# Patient Record
Sex: Male | Born: 1960 | Race: White | Hispanic: No | Marital: Married | Smoking: Never smoker
Health system: Southern US, Community
[De-identification: ages and names within clinical notes are randomized; demographics above are authoritative.]

## PROBLEM LIST (undated history)

## (undated) DIAGNOSIS — I4891 Unspecified atrial fibrillation: Secondary | ICD-10-CM

## (undated) DIAGNOSIS — I9789 Other postprocedural complications and disorders of the circulatory system, not elsewhere classified: Secondary | ICD-10-CM

## (undated) DIAGNOSIS — I779 Disorder of arteries and arterioles, unspecified: Secondary | ICD-10-CM

## (undated) DIAGNOSIS — I255 Ischemic cardiomyopathy: Secondary | ICD-10-CM

## (undated) DIAGNOSIS — I340 Nonrheumatic mitral (valve) insufficiency: Secondary | ICD-10-CM

## (undated) DIAGNOSIS — E785 Hyperlipidemia, unspecified: Secondary | ICD-10-CM

## (undated) DIAGNOSIS — I2510 Atherosclerotic heart disease of native coronary artery without angina pectoris: Secondary | ICD-10-CM

## (undated) DIAGNOSIS — I6529 Occlusion and stenosis of unspecified carotid artery: Secondary | ICD-10-CM

## (undated) DIAGNOSIS — I10 Essential (primary) hypertension: Secondary | ICD-10-CM

## (undated) DIAGNOSIS — Z9289 Personal history of other medical treatment: Secondary | ICD-10-CM

## (undated) HISTORY — DX: Essential (primary) hypertension: I10

## (undated) HISTORY — DX: Occlusion and stenosis of unspecified carotid artery: I65.29

## (undated) HISTORY — DX: Hyperlipidemia, unspecified: E78.5

## (undated) HISTORY — DX: Other postprocedural complications and disorders of the circulatory system, not elsewhere classified: I97.89

## (undated) HISTORY — DX: Atherosclerotic heart disease of native coronary artery without angina pectoris: I25.10

## (undated) HISTORY — DX: Unspecified atrial fibrillation: I48.91

## (undated) HISTORY — DX: Personal history of other medical treatment: Z92.89

---

## 2011-11-11 ENCOUNTER — Encounter: Payer: Self-pay | Admitting: Internal Medicine

## 2011-12-12 ENCOUNTER — Encounter: Payer: Self-pay | Admitting: Gastroenterology

## 2011-12-12 ENCOUNTER — Ambulatory Visit (AMBULATORY_SURGERY_CENTER): Payer: PRIVATE HEALTH INSURANCE | Admitting: *Deleted

## 2011-12-12 VITALS — Ht 71.0 in | Wt 185.0 lb

## 2011-12-12 DIAGNOSIS — Z1211 Encounter for screening for malignant neoplasm of colon: Secondary | ICD-10-CM

## 2011-12-12 MED ORDER — PEG-KCL-NACL-NASULF-NA ASC-C 100 G PO SOLR: ORAL | Status: DC

## 2011-12-15 ENCOUNTER — Encounter: Payer: Self-pay | Admitting: Internal Medicine

## 2011-12-26 ENCOUNTER — Ambulatory Visit (AMBULATORY_SURGERY_CENTER): Payer: PRIVATE HEALTH INSURANCE | Admitting: Gastroenterology

## 2011-12-26 ENCOUNTER — Encounter: Payer: Self-pay | Admitting: Gastroenterology

## 2011-12-26 VITALS — BP 131/85 | HR 72 | Temp 98.0°F | Resp 12 | Ht 71.0 in | Wt 185.0 lb

## 2011-12-26 DIAGNOSIS — K621 Rectal polyp: Secondary | ICD-10-CM

## 2011-12-26 DIAGNOSIS — K620 Anal polyp: Secondary | ICD-10-CM

## 2011-12-26 DIAGNOSIS — D126 Benign neoplasm of colon, unspecified: Secondary | ICD-10-CM

## 2011-12-26 DIAGNOSIS — Z1211 Encounter for screening for malignant neoplasm of colon: Secondary | ICD-10-CM

## 2011-12-26 MED ORDER — SODIUM CHLORIDE 0.9 % IV SOLN: 500.0000 mL | INTRAVENOUS | Status: DC

## 2011-12-26 NOTE — Patient Instructions (Signed)
YOU HAD AN ENDOSCOPIC PROCEDURE TODAY AT THE [facility]: Refer to the procedure report that was given to you for any specific questions about what was found during the examination.  If the procedure report does not answer your questions, please call your gastroenterologist to clarify.  If you requested that your care partner not be given the details of your procedure findings, then the procedure report has been included in a sealed envelope for you to review at your convenience later.  YOU SHOULD EXPECT: Some feelings of bloating in the abdomen. Passage of more gas than usual.  Walking can help get rid of the air that was put into your GI tract during the procedure and reduce the bloating. If you had a lower endoscopy (such as a colonoscopy or flexible sigmoidoscopy) you may notice spotting of blood in your stool or on the toilet paper. If you underwent a bowel prep for your procedure, then you may not have a normal bowel movement for a few days.  DIET: Your first meal following the procedure should be a light meal and then it is ok to progress to your normal diet.  A half-sandwich or bowl of soup is an example of a good first meal.  Heavy or fried foods are harder to digest and may make you feel nauseous or bloated.  Likewise meals heavy in dairy and vegetables can cause extra gas to form and this can also increase the bloating.  Drink plenty of fluids but you should avoid alcoholic beverages for 24 hours.  ACTIVITY: Your care partner should take you home directly after the procedure.  You should plan to take it easy, moving slowly for the rest of the day.  You can resume normal activity the day after the procedure however you should NOT DRIVE or use heavy machinery for 24 hours (because of the sedation medicines used during the test).    SYMPTOMS TO REPORT IMMEDIATELY: A gastroenterologist can be reached at any hour.  During normal business hours, 8:30 AM to 5:00 PM Monday through Friday,  call [phone].  After hours and on weekends, please call the GI answering service at [phone] who will take a message and have the physician on call contact you.   Following lower endoscopy (colonoscopy or flexible sigmoidoscopy):  Excessive amounts of blood in the stool  Significant tenderness or worsening of abdominal pains  Swelling of the abdomen that is new, acute  Fever of 100F or higher    FOLLOW UP: If any biopsies were taken you will be contacted by phone or by letter within the next 1-3 weeks.  Call your gastroenterologist if you have not heard about the biopsies in 3 weeks.  Our staff will call the home number listed on your records the next business day following your procedure to check on you and address any questions or concerns that you may have at that time regarding the information given to you following your procedure. This is a courtesy call and so if there is no answer at the home number and we have not heard from you through the emergency physician on call, we will assume that you have returned to your regular daily activities without incident.  SIGNATURES/CONFIDENTIALITY: You and/or your care partner have signed paperwork which will be entered into your electronic medical record.  These signatures attest to the fact that that the information above on your After Visit Summary has been reviewed and is understood.  Full responsibility of the confidentiality   of this discharge information lies with you and/or your care-partner.    You will receive a letter from Dr. Jarold Motto with results of biopsy.

## 2011-12-26 NOTE — Addendum Note (Signed)
Addended by: Margo Aye on: 12/26/2011 09:31 AM   Modules accepted: Orders

## 2011-12-26 NOTE — Progress Notes (Signed)
The pt tolerated the colonoscopy very well. Maw   

## 2011-12-26 NOTE — Op Note (Signed)
[facility] [address] [place], [place]  [address]  COLONOSCOPY PROCEDURE REPORT  PATIENT:  Joel, Berger  MR#:  [id number] BIRTHDATE:  05-28-1961, 51 yrs. old  GENDER:  male ENDOSCOPIST:  Vania Rea. Jarold Motto, MD, [facility] REF. BY: PROCEDURE DATE:  12/26/2011 PROCEDURE:  Colonoscopy with biopsy ASA CLASS:  Class II INDICATIONS:  Routine Risk Screening MEDICATIONS:   propofol (Diprivan) 300 mg IV  DESCRIPTION OF PROCEDURE:   After the risks and benefits and of the procedure were explained, informed consent was obtained. Digital rectal exam was performed and revealed no abnormalities. The LB CF-[id number] endoscope was introduced through the anus and advanced to the .  The quality of the prep was adequate, using MoviPrep.  The instrument was then slowly withdrawn as the colon was fully examined. <<PROCEDUREIMAGES>>  FINDINGS:  No polyps or cancers were seen. A SMALL RECTAL NODULE COLD BIOPSY REMOVED.  This was otherwise a normal examination of the colon.   Retroflexed views in the rectum revealed no abnormalities.    The scope was then withdrawn from the patient and the procedure completed.  COMPLICATIONS:  None ENDOSCOPIC IMPRESSION: 1) No polyps or cancers 2) Otherwise normal examination RECOMMENDATIONS: 1) Await biopsy results 2) Repeat colonoscopy in 5 years if polyp adenomatous; otherwise 10 years 3) Continue current medications  REPEAT EXAM:  No  ______________________________ Vania Rea. Jarold Motto, MD, [facility]  CC:  n. eSIGNED:   Vania Rea. Eulia Hatcher at 12/26/2011 08:54 AM  [place], Riley Lam, [phone]

## 2011-12-26 NOTE — Progress Notes (Signed)
Patient did not experience any of the following events: a burn prior to discharge; a fall within the facility; wrong site/side/patient/procedure/implant event; or a hospital transfer or hospital admission upon discharge from the facility. (G8907) Patient did not have preoperative order for IV antibiotic SSI prophylaxis. (G8918)  

## 2011-12-27 ENCOUNTER — Telehealth: Payer: Self-pay

## 2011-12-27 NOTE — Telephone Encounter (Signed)
  Follow up Call-  Call back number 12/26/2011  Post procedure Call Back phone  # [phone]  Permission to leave phone message Yes     Patient questions:  Do you have a fever, pain , or abdominal swelling? no Pain Score  0 *  Have you tolerated food without any problems? yes  Have you been able to return to your normal activities? yes  Do you have any questions about your discharge instructions: Diet   no Medications  no Follow up visit  no  Do you have questions or concerns about your Care? no  Actions: * If pain score is 4 or above: No action needed, pain <4.  Per the pt, "I am okay". Maw

## 2011-12-30 ENCOUNTER — Encounter: Payer: Self-pay | Admitting: Gastroenterology

## 2015-03-01 ENCOUNTER — Inpatient Hospital Stay (HOSPITAL_COMMUNITY)
Admission: EM | Admit: 2015-03-01 | Discharge: 2015-03-08 | DRG: 234 | Disposition: A | Discharge status: Home / Self Care | Payer: 59 | Attending: Cardiothoracic Surgery | Admitting: Cardiothoracic Surgery

## 2015-03-01 ENCOUNTER — Encounter (HOSPITAL_COMMUNITY): Payer: Self-pay | Admitting: Emergency Medicine

## 2015-03-01 ENCOUNTER — Emergency Department (HOSPITAL_COMMUNITY): Payer: 59

## 2015-03-01 ENCOUNTER — Encounter (HOSPITAL_COMMUNITY)
Admission: EM | Disposition: A | Discharge status: Home / Self Care | Payer: Self-pay | Source: Home / Self Care | Attending: Cardiothoracic Surgery

## 2015-03-01 DIAGNOSIS — I4891 Unspecified atrial fibrillation: Secondary | ICD-10-CM | POA: Diagnosis not present

## 2015-03-01 DIAGNOSIS — Z01810 Encounter for preprocedural cardiovascular examination: Secondary | ICD-10-CM | POA: Diagnosis not present

## 2015-03-01 DIAGNOSIS — I214 Non-ST elevation (NSTEMI) myocardial infarction: Secondary | ICD-10-CM

## 2015-03-01 DIAGNOSIS — R079 Chest pain, unspecified: Secondary | ICD-10-CM | POA: Diagnosis present

## 2015-03-01 DIAGNOSIS — E119 Type 2 diabetes mellitus without complications: Secondary | ICD-10-CM

## 2015-03-01 DIAGNOSIS — R0602 Shortness of breath: Secondary | ICD-10-CM

## 2015-03-01 DIAGNOSIS — I10 Essential (primary) hypertension: Secondary | ICD-10-CM | POA: Diagnosis not present

## 2015-03-01 DIAGNOSIS — I25111 Atherosclerotic heart disease of native coronary artery with angina pectoris with documented spasm: Secondary | ICD-10-CM

## 2015-03-01 DIAGNOSIS — I2119 ST elevation (STEMI) myocardial infarction involving other coronary artery of inferior wall: Principal | ICD-10-CM | POA: Diagnosis present

## 2015-03-01 DIAGNOSIS — I2111 ST elevation (STEMI) myocardial infarction involving right coronary artery: Secondary | ICD-10-CM

## 2015-03-01 DIAGNOSIS — E785 Hyperlipidemia, unspecified: Secondary | ICD-10-CM | POA: Diagnosis present

## 2015-03-01 DIAGNOSIS — I4892 Unspecified atrial flutter: Secondary | ICD-10-CM | POA: Diagnosis not present

## 2015-03-01 DIAGNOSIS — I319 Disease of pericardium, unspecified: Secondary | ICD-10-CM | POA: Diagnosis not present

## 2015-03-01 DIAGNOSIS — I213 ST elevation (STEMI) myocardial infarction of unspecified site: Secondary | ICD-10-CM | POA: Diagnosis present

## 2015-03-01 DIAGNOSIS — I25110 Atherosclerotic heart disease of native coronary artery with unstable angina pectoris: Secondary | ICD-10-CM | POA: Diagnosis present

## 2015-03-01 DIAGNOSIS — E8770 Fluid overload, unspecified: Secondary | ICD-10-CM | POA: Diagnosis not present

## 2015-03-01 DIAGNOSIS — I241 Dressler's syndrome: Secondary | ICD-10-CM | POA: Diagnosis not present

## 2015-03-01 DIAGNOSIS — I441 Atrioventricular block, second degree: Secondary | ICD-10-CM | POA: Diagnosis not present

## 2015-03-01 DIAGNOSIS — I2510 Atherosclerotic heart disease of native coronary artery without angina pectoris: Secondary | ICD-10-CM | POA: Diagnosis not present

## 2015-03-01 DIAGNOSIS — Z8249 Family history of ischemic heart disease and other diseases of the circulatory system: Secondary | ICD-10-CM

## 2015-03-01 DIAGNOSIS — Z951 Presence of aortocoronary bypass graft: Secondary | ICD-10-CM

## 2015-03-01 DIAGNOSIS — I9789 Other postprocedural complications and disorders of the circulatory system, not elsewhere classified: Secondary | ICD-10-CM

## 2015-03-01 HISTORY — PX: CARDIAC CATHETERIZATION: SHX172

## 2015-03-01 LAB — I-STAT CHEM 8, ED
BUN: 21 mg/dL — ABNORMAL HIGH (ref 6–20)
CALCIUM ION: 1.16 mmol/L (ref 1.12–1.23)
CHLORIDE: 97 mmol/L — AB (ref 101–111)
Creatinine, Ser: 0.7 mg/dL (ref 0.61–1.24)
Glucose, Bld: 263 mg/dL — ABNORMAL HIGH (ref 65–99)
HEMATOCRIT: 47 % (ref 39.0–52.0)
Hemoglobin: 16 g/dL (ref 13.0–17.0)
Potassium: 3.7 mmol/L (ref 3.5–5.1)
SODIUM: 138 mmol/L (ref 135–145)
TCO2: 27 mmol/L (ref 0–100)

## 2015-03-01 LAB — CBC
HEMATOCRIT: 43.2 % (ref 39.0–52.0)
HEMOGLOBIN: 15 g/dL (ref 13.0–17.0)
MCH: 29.7 pg (ref 26.0–34.0)
MCHC: 34.7 g/dL (ref 30.0–36.0)
MCV: 85.5 fL (ref 78.0–100.0)
Platelets: 196 10*3/uL (ref 150–400)
RBC: 5.05 MIL/uL (ref 4.22–5.81)
RDW: 13.1 % (ref 11.5–15.5)
WBC: 11.6 10*3/uL — AB (ref 4.0–10.5)

## 2015-03-01 LAB — BASIC METABOLIC PANEL
ANION GAP: 9 (ref 5–15)
BUN: 19 mg/dL (ref 6–20)
CALCIUM: 9.2 mg/dL (ref 8.9–10.3)
CO2: 27 mmol/L (ref 22–32)
Chloride: 99 mmol/L — ABNORMAL LOW (ref 101–111)
Creatinine, Ser: 0.67 mg/dL (ref 0.61–1.24)
GLUCOSE: 266 mg/dL — AB (ref 65–99)
POTASSIUM: 3.8 mmol/L (ref 3.5–5.1)
SODIUM: 135 mmol/L (ref 135–145)

## 2015-03-01 LAB — HEPATIC FUNCTION PANEL
ALBUMIN: 4.5 g/dL (ref 3.5–5.0)
ALT: 52 U/L (ref 17–63)
AST: 215 U/L — AB (ref 15–41)
Alkaline Phosphatase: 84 U/L (ref 38–126)
BILIRUBIN DIRECT: 0.2 mg/dL (ref 0.1–0.5)
BILIRUBIN TOTAL: 1.5 mg/dL — AB (ref 0.3–1.2)
Indirect Bilirubin: 1.3 mg/dL — ABNORMAL HIGH (ref 0.3–0.9)
Total Protein: 7.8 g/dL (ref 6.5–8.1)

## 2015-03-01 LAB — LIPASE, BLOOD: LIPASE: 15 U/L — AB (ref 22–51)

## 2015-03-01 LAB — GLUCOSE, CAPILLARY
GLUCOSE-CAPILLARY: 189 mg/dL — AB (ref 65–99)
GLUCOSE-CAPILLARY: 186 mg/dL — AB (ref 65–99)
Glucose-Capillary: 217 mg/dL — ABNORMAL HIGH (ref 65–99)
Glucose-Capillary: 207 mg/dL — ABNORMAL HIGH (ref 65–99)

## 2015-03-01 LAB — I-STAT TROPONIN, ED: TROPONIN I, POC: 21.33 ng/mL — AB (ref 0.00–0.08)

## 2015-03-01 LAB — MRSA PCR SCREENING: MRSA by PCR: NEGATIVE

## 2015-03-01 LAB — TROPONIN I
TROPONIN I: 9.76 ng/mL — AB (ref <0.031)
TROPONIN I: 11.54 ng/mL — AB (ref <0.031)
Troponin I: 16.36 ng/mL (ref <0.031)

## 2015-03-01 LAB — TSH: TSH: 0.678 u[IU]/mL (ref 0.350–4.500)

## 2015-03-01 LAB — APTT: APTT: 29 s (ref 24–37)

## 2015-03-01 LAB — PROTIME-INR
INR: 1.05 (ref 0.00–1.49)
PROTHROMBIN TIME: 13.9 s (ref 11.6–15.2)

## 2015-03-01 SURGERY — LEFT HEART CATH AND CORONARY ANGIOGRAPHY

## 2015-03-01 MED ORDER — ACETAMINOPHEN 325 MG PO TABS
650.0000 mg | ORAL_TABLET | ORAL | Status: DC | PRN
  Administered 2015-03-02: 650 mg via ORAL
  Filled 2015-03-01: qty 2

## 2015-03-01 MED ORDER — NITROGLYCERIN 1 MG/10 ML FOR IR/CATH LAB
INTRA_ARTERIAL | Status: DC | PRN
  Administered 2015-03-01: 11:00:00

## 2015-03-01 MED ORDER — SODIUM CHLORIDE 0.9 % IV SOLN: 250.0000 mL | INTRAVENOUS | Status: DC | PRN

## 2015-03-01 MED ORDER — SODIUM CHLORIDE 0.9 % IJ SOLN: 3.0000 mL | INTRAMUSCULAR | Status: DC | PRN

## 2015-03-01 MED ORDER — ASPIRIN EC 325 MG PO TBEC
325.0000 mg | DELAYED_RELEASE_TABLET | ORAL | Status: AC
  Administered 2015-03-01: 325 mg via ORAL
  Filled 2015-03-01: qty 1

## 2015-03-01 MED ORDER — LISINOPRIL 2.5 MG PO TABS
2.5000 mg | ORAL_TABLET | Freq: Every day | ORAL | Status: DC
  Administered 2015-03-01 – 2015-03-02 (×2): 2.5 mg via ORAL
  Filled 2015-03-01 (×2): qty 1

## 2015-03-01 MED ORDER — MIDAZOLAM HCL 2 MG/2ML IJ SOLN
INTRAMUSCULAR | Status: AC
  Filled 2015-03-01: qty 4

## 2015-03-01 MED ORDER — ONDANSETRON HCL 4 MG/2ML IJ SOLN
INTRAMUSCULAR | Status: AC
  Filled 2015-03-01: qty 2

## 2015-03-01 MED ORDER — VERAPAMIL HCL 2.5 MG/ML IV SOLN
INTRAVENOUS | Status: DC | PRN
  Administered 2015-03-01: 11:00:00 via INTRA_ARTERIAL

## 2015-03-01 MED ORDER — SIMVASTATIN 40 MG PO TABS
40.0000 mg | ORAL_TABLET | Freq: Every day | ORAL | Status: DC
  Administered 2015-03-01 – 2015-03-06 (×5): 40 mg via ORAL
  Filled 2015-03-01: qty 1
  Filled 2015-03-01: qty 2
  Filled 2015-03-01 (×3): qty 1
  Filled 2015-03-01: qty 2
  Filled 2015-03-01 (×3): qty 1

## 2015-03-01 MED ORDER — NITROGLYCERIN 1 MG/10 ML FOR IR/CATH LAB
INTRA_ARTERIAL | Status: AC
  Filled 2015-03-01: qty 10

## 2015-03-01 MED ORDER — SODIUM CHLORIDE 0.9 % IJ SOLN
3.0000 mL | Freq: Two times a day (BID) | INTRAMUSCULAR | Status: DC
  Administered 2015-03-01 (×2): 3 mL via INTRAVENOUS

## 2015-03-01 MED ORDER — NITROGLYCERIN IN D5W 200-5 MCG/ML-% IV SOLN
5.0000 ug/min | INTRAVENOUS | Status: DC
  Administered 2015-03-01: 5 ug/min via INTRAVENOUS
  Administered 2015-03-01: 10 ug/min via INTRAVENOUS
  Administered 2015-03-03: 30 ug/min via INTRAVENOUS
  Filled 2015-03-01: qty 250

## 2015-03-01 MED ORDER — METOPROLOL TARTRATE 12.5 MG HALF TABLET
12.5000 mg | ORAL_TABLET | Freq: Two times a day (BID) | ORAL | Status: DC
  Administered 2015-03-01 – 2015-03-02 (×4): 12.5 mg via ORAL
  Filled 2015-03-01 (×5): qty 1

## 2015-03-01 MED ORDER — ASPIRIN EC 325 MG PO TBEC: 325.0000 mg | DELAYED_RELEASE_TABLET | Freq: Every day | ORAL | Status: DC

## 2015-03-01 MED ORDER — ROPINIROLE HCL 0.5 MG PO TABS
0.5000 mg | ORAL_TABLET | Freq: Every day | ORAL | Status: DC
  Administered 2015-03-01 – 2015-03-02 (×2): 0.5 mg via ORAL
  Filled 2015-03-01 (×3): qty 1

## 2015-03-01 MED ORDER — SODIUM CHLORIDE 0.9 % IV SOLN
INTRAVENOUS | Status: DC
Start: 2015-03-02 — End: 2015-03-01

## 2015-03-01 MED ORDER — GLIPIZIDE 10 MG PO TABS
10.0000 mg | ORAL_TABLET | Freq: Two times a day (BID) | ORAL | Status: DC
  Administered 2015-03-01 – 2015-03-02 (×3): 10 mg via ORAL
  Filled 2015-03-01 (×6): qty 1

## 2015-03-01 MED ORDER — ACETAMINOPHEN 325 MG PO TABS: 650.0000 mg | ORAL_TABLET | ORAL | Status: DC | PRN

## 2015-03-01 MED ORDER — FENTANYL CITRATE (PF) 100 MCG/2ML IJ SOLN
INTRAMUSCULAR | Status: DC | PRN
  Administered 2015-03-01: 50 ug via INTRAVENOUS

## 2015-03-01 MED ORDER — FENTANYL CITRATE (PF) 100 MCG/2ML IJ SOLN
INTRAMUSCULAR | Status: AC
Start: 2015-03-01 — End: 2015-03-01
  Filled 2015-03-01: qty 4

## 2015-03-01 MED ORDER — SODIUM CHLORIDE 0.9 % IJ SOLN: 3.0000 mL | Freq: Two times a day (BID) | INTRAMUSCULAR | Status: DC

## 2015-03-01 MED ORDER — HEPARIN (PORCINE) IN NACL 100-0.45 UNIT/ML-% IJ SOLN
1500.0000 [IU]/h | INTRAMUSCULAR | Status: DC
  Administered 2015-03-01: 1150 [IU]/h via INTRAVENOUS
  Filled 2015-03-01: qty 250

## 2015-03-01 MED ORDER — SODIUM CHLORIDE 0.9 % WEIGHT BASED INFUSION
1.0000 mL/kg/h | INTRAVENOUS | Status: AC
  Administered 2015-03-01: 1 mL/kg/h via INTRAVENOUS

## 2015-03-01 MED ORDER — ONDANSETRON HCL 4 MG/2ML IJ SOLN
4.0000 mg | Freq: Once | INTRAMUSCULAR | Status: AC
  Administered 2015-03-01: 4 mg via INTRAVENOUS

## 2015-03-01 MED ORDER — HEPARIN BOLUS VIA INFUSION
4000.0000 [IU] | Freq: Once | INTRAVENOUS | Status: AC
  Administered 2015-03-01: 4000 [IU] via INTRAVENOUS
  Filled 2015-03-01: qty 4000

## 2015-03-01 MED ORDER — ATORVASTATIN CALCIUM 80 MG PO TABS: 80.0000 mg | ORAL_TABLET | Freq: Every day | ORAL | Status: DC

## 2015-03-01 MED ORDER — INSULIN ASPART 100 UNIT/ML ~~LOC~~ SOLN
0.0000 [IU] | Freq: Three times a day (TID) | SUBCUTANEOUS | Status: DC
  Administered 2015-03-01: 2 [IU] via SUBCUTANEOUS
  Administered 2015-03-01: 3 [IU] via SUBCUTANEOUS
  Administered 2015-03-02 (×2): 2 [IU] via SUBCUTANEOUS
  Administered 2015-03-02: 3 [IU] via SUBCUTANEOUS

## 2015-03-01 MED ORDER — LIDOCAINE HCL (PF) 1 % IJ SOLN
INTRAMUSCULAR | Status: AC
  Filled 2015-03-01: qty 30

## 2015-03-01 MED ORDER — HEPARIN (PORCINE) IN NACL 100-0.45 UNIT/ML-% IJ SOLN
1200.0000 [IU]/h | INTRAMUSCULAR | Status: DC
  Administered 2015-03-01: 1200 [IU]/h via INTRAVENOUS
  Filled 2015-03-01 (×2): qty 250

## 2015-03-01 MED ORDER — NITROGLYCERIN IN D5W 200-5 MCG/ML-% IV SOLN
INTRAVENOUS | Status: AC
  Filled 2015-03-01: qty 250

## 2015-03-01 MED ORDER — ASPIRIN EC 81 MG PO TBEC
81.0000 mg | DELAYED_RELEASE_TABLET | Freq: Every day | ORAL | Status: DC
  Administered 2015-03-02: 81 mg via ORAL
  Filled 2015-03-01: qty 1

## 2015-03-01 MED ORDER — ONDANSETRON HCL 4 MG/2ML IJ SOLN
4.0000 mg | Freq: Four times a day (QID) | INTRAMUSCULAR | Status: DC | PRN
  Administered 2015-03-01 (×2): 4 mg via INTRAVENOUS
  Filled 2015-03-01 (×2): qty 2

## 2015-03-01 MED ORDER — MIDAZOLAM HCL 2 MG/2ML IJ SOLN
INTRAMUSCULAR | Status: DC | PRN
  Administered 2015-03-01: 1 mg via INTRAVENOUS

## 2015-03-01 MED ORDER — GABAPENTIN 600 MG PO TABS
600.0000 mg | ORAL_TABLET | Freq: Three times a day (TID) | ORAL | Status: DC
  Administered 2015-03-01 – 2015-03-08 (×26): 600 mg via ORAL
  Filled 2015-03-01 (×32): qty 1

## 2015-03-01 MED ORDER — PROMETHAZINE HCL 25 MG/ML IJ SOLN
25.0000 mg | Freq: Four times a day (QID) | INTRAMUSCULAR | Status: DC | PRN
  Administered 2015-03-01: 25 mg via INTRAVENOUS
  Filled 2015-03-01: qty 1

## 2015-03-01 SURGICAL SUPPLY — 8 items
CATH INFINITI 5 FR JL3.5 (CATHETERS) ×3 IMPLANT
CATH INFINITI JR4 5F (CATHETERS) ×3 IMPLANT
GLIDESHEATH SLEND A-KIT 6F 22G (SHEATH) ×3 IMPLANT
KIT HEART LEFT (KITS) ×3 IMPLANT
PACK CARDIAC CATHETERIZATION (CUSTOM PROCEDURE TRAY) ×3 IMPLANT
TRANSDUCER W/MONITORING KIT (MISCELLANEOUS) ×3 IMPLANT
TUBING CIL FLEX 10 FLL-RA (TUBING) ×3 IMPLANT
WIRE SAFE-T 1.5MM-J .035X260CM (WIRE) ×3 IMPLANT

## 2015-03-01 NOTE — H&P (Signed)
Cardiologist: Rexene Edison Delawder is an 54 y.o. male.   Chief Complaint: Chest Pain HPI:  The patient is a 54 year old male with a history of diabetes and hypertension. Patient's father had his first heart attack at age 20 and the subsequent 1 and 28. His brother had one at age 66. The patient reports on Friday he was mowing his grass and became nauseated. He was having a lot of gas and burping. This tapered off. However, yesterday around 4 PM he became nauseated and vomited he was having some slight chest discomfort. He woke at 1 AM with similar symptoms and that 0330 hrs. he developed more chest pain and at 0430hrs it was 8 out of 10. He did feel a little bit sweaty but had no shortness of breath. Currently having 3 out of 10 chest pressure.      Past Medical History  Diagnosis Date  . Diabetes mellitus   . Hypertension     History reviewed. No pertinent past surgical history.   Family History  Problem Relation Age of Onset  . Colon polyps Paternal Uncle   . Colon cancer Neg Hx    Social History:  reports that he has never smoked. He has never used smokeless tobacco. He reports that he drinks alcohol. He reports that he does not use illicit drugs.  Allergies:  Allergies  Allergen Reactions  . Codeine Rash    Medications Prior to Admission  Medication Sig Dispense Refill  . gabapentin (NEURONTIN) 600 MG tablet Take 600 mg by mouth 4 (four) times daily - after meals and at bedtime.    Marland Kitchen glipiZIDE (GLUCOTROL) 10 MG tablet Take 10 mg by mouth Twice daily.     Marland Kitchen lisinopril (PRINIVIL,ZESTRIL) 2.5 MG tablet Take 2.5 mg by mouth Daily.     . metFORMIN (GLUCOPHAGE) 1000 MG tablet Take 1,000 mg by mouth Twice daily.     Marland Kitchen rOPINIRole (REQUIP) 0.5 MG tablet Take 0.5 mg by mouth at bedtime.    Marland Kitchen ACCU-CHEK AVIVA PLUS test strip       Results for orders placed or performed during the hospital encounter of 03/01/15 (from the past 48 hour(s))  Basic metabolic panel     Status: Abnormal     Collection Time: 03/01/15  5:24 AM  Result Value Ref Range   Sodium 135 135 - 145 mmol/L   Potassium 3.8 3.5 - 5.1 mmol/L   Chloride 99 (L) 101 - 111 mmol/L   CO2 27 22 - 32 mmol/L   Glucose, Bld 266 (H) 65 - 99 mg/dL   BUN 19 6 - 20 mg/dL   Creatinine, Ser 0.67 0.61 - 1.24 mg/dL   Calcium 9.2 8.9 - 10.3 mg/dL   GFR calc non Af Amer >60 >60 mL/min   GFR calc Af Amer >60 >60 mL/min    Comment: (NOTE) The eGFR has been calculated using the CKD EPI equation. This calculation has not been validated in all clinical situations. eGFR's persistently <60 mL/min signify possible Chronic Kidney Disease.    Anion gap 9 5 - 15  CBC     Status: Abnormal   Collection Time: 03/01/15  5:24 AM  Result Value Ref Range   WBC 11.6 (H) 4.0 - 10.5 K/uL   RBC 5.05 4.22 - 5.81 MIL/uL   Hemoglobin 15.0 13.0 - 17.0 g/dL   HCT 43.2 39.0 - 52.0 %   MCV 85.5 78.0 - 100.0 fL   MCH 29.7 26.0 - 34.0 pg  MCHC 34.7 30.0 - 36.0 g/dL   RDW 13.1 11.5 - 15.5 %   Platelets 196 150 - 400 K/uL  Hepatic function panel     Status: Abnormal   Collection Time: 03/01/15  5:24 AM  Result Value Ref Range   Total Protein 7.8 6.5 - 8.1 g/dL   Albumin 4.5 3.5 - 5.0 g/dL   AST 215 (H) 15 - 41 U/L   ALT 52 17 - 63 U/L   Alkaline Phosphatase 84 38 - 126 U/L   Total Bilirubin 1.5 (H) 0.3 - 1.2 mg/dL   Bilirubin, Direct 0.2 0.1 - 0.5 mg/dL   Indirect Bilirubin 1.3 (H) 0.3 - 0.9 mg/dL  Lipase, blood     Status: Abnormal   Collection Time: 03/01/15  5:24 AM  Result Value Ref Range   Lipase 15 (L) 22 - 51 U/L  I-stat troponin, ED     Status: Abnormal   Collection Time: 03/01/15  5:27 AM  Result Value Ref Range   Troponin i, poc 21.33 (HH) 0.00 - 0.08 ng/mL   Comment NOTIFIED PHYSICIAN    Comment 3            Comment: Due to the release kinetics of cTnI, a negative result within the first hours of the onset of symptoms does not rule out myocardial infarction with certainty. If myocardial infarction is still  suspected, repeat the test at appropriate intervals.   I-Stat Chem 8, ED     Status: Abnormal   Collection Time: 03/01/15  5:46 AM  Result Value Ref Range   Sodium 138 135 - 145 mmol/L   Potassium 3.7 3.5 - 5.1 mmol/L   Chloride 97 (L) 101 - 111 mmol/L   BUN 21 (H) 6 - 20 mg/dL   Creatinine, Ser 0.70 0.61 - 1.24 mg/dL   Glucose, Bld 263 (H) 65 - 99 mg/dL   Calcium, Ion 1.16 1.12 - 1.23 mmol/L   TCO2 27 0 - 100 mmol/L   Hemoglobin 16.0 13.0 - 17.0 g/dL   HCT 47.0 39.0 - 52.0 %  Protime-INR     Status: None   Collection Time: 03/01/15  5:46 AM  Result Value Ref Range   Prothrombin Time 13.9 11.6 - 15.2 seconds   INR 1.05 0.00 - 1.49  APTT     Status: None   Collection Time: 03/01/15  5:46 AM  Result Value Ref Range   aPTT 29 24 - 37 seconds  Glucose, capillary     Status: Abnormal   Collection Time: 03/01/15  8:46 AM  Result Value Ref Range   Glucose-Capillary 217 (H) 65 - 99 mg/dL   Comment 1 Notify RN    Dg Chest 2 View  03/01/2015   CLINICAL DATA:  Intermittent chest pain for 2 days.  EXAM: CHEST  2 VIEW  COMPARISON:  None.  FINDINGS: Lung volumes are low. There is bibasilar atelectasis. The cardiomediastinal contours are normal. The lungs are clear. Pulmonary vasculature is normal. No consolidation, pleural effusion, or pneumothorax. No acute osseous abnormalities are seen. There is degenerative change in the thoracic spine.  IMPRESSION: Hypoventilatory chest with bibasilar atelectasis.   Electronically Signed   By: Jeb Levering M.D.   On: 03/01/2015 06:00    Review of Systems  Constitutional: Positive for diaphoresis. Negative for fever.  HENT: Negative for congestion and sore throat.   Respiratory: Negative for cough and shortness of breath.   Cardiovascular: Positive for chest pain and orthopnea. Negative for leg swelling and  PND.  Gastrointestinal: Positive for nausea and vomiting. Negative for abdominal pain, blood in stool and melena.  Neurological: Negative for  dizziness.  All other systems reviewed and are negative.   Blood pressure 128/75, pulse 84, temperature 99.4 F (37.4 C), temperature source Oral, resp. rate 28, height 6' (1.829 m), weight 207 lb 3.2 oz (93.985 kg), SpO2 93 %. Physical Exam  Nursing note and vitals reviewed. Constitutional: He is oriented to person, place, and time. He appears well-developed and well-nourished. No distress.  HENT:  Head: Normocephalic and atraumatic.  Eyes: EOM are normal. Pupils are equal, round, and reactive to light. No scleral icterus.  Neck: Normal range of motion. Neck supple. No JVD present.  Cardiovascular: Normal rate, regular rhythm, S1 normal and S2 normal.   No murmur heard. Pulses:      Radial pulses are 2+ on the right side, and 2+ on the left side.       Dorsalis pedis pulses are 2+ on the right side, and 2+ on the left side.  Respiratory: Effort normal and breath sounds normal. He has no wheezes. He has no rales.  GI: Soft. Bowel sounds are normal. He exhibits no distension. There is no tenderness.  Musculoskeletal: He exhibits no edema.  Lymphadenopathy:    He has no cervical adenopathy.  Neurological: He is alert and oriented to person, place, and time. He exhibits normal muscle tone.  Skin: Skin is warm.  Psychiatric: He has a normal mood and affect.     Assessment/Plan Principal Problem:   STEMI (ST elevation myocardial infarction)-Inferior Active Problems:   Essential hypertension   Diabetes mellitus type 2 in nonobese  Patient is already on IV heparin which we will continue. I'm adding IV nitroglycerin as well since he is still having 3 out of 10 chest pressure. Repeat EKG. Initial troponin is greater than 20. We'll continue to cycle. A low-dose Lopressor and simvastatin. Patient's had previous muscle pains on atorvastatin.  Continue glipizide and add sliding scale. Hold metformin for catheterization.  Continue lisinopril 2.5 mg.  The patient will be taken emergently tot he  cath lab.  Tarri Fuller, [place] 03/01/2015, 9:46 AM

## 2015-03-01 NOTE — Progress Notes (Signed)
CRITICAL VALUE ALERT  Critical value received:  Troponin 9.6  Date of notification:03/01/15  Time of notification:  1945  Critical value read back:Yes.    Nurse who received alert:  Thurmond Butts   MD notified (1st page): Expected lab value post cath

## 2015-03-01 NOTE — ED Notes (Signed)
[facility] here to transport patient to [facility] EKG done and Zofran given prior to transport

## 2015-03-01 NOTE — Progress Notes (Signed)
Utilization review completed.  

## 2015-03-01 NOTE — ED Notes (Signed)
Pt arrived to the ED with a complaint of chest pain.  Pt states the pain has been intermittent since Friday.   Pt describes pain as a pressure in the mid chest.  Pt states pain has gotten so severe that he has had episodes of emesis.  Pt has been burping since Friday.  Pt has been weak, nauseated, diaphoretic, with a feeling that he cant suck in enough air.

## 2015-03-01 NOTE — Progress Notes (Signed)
Patient taken to Cath lab. Significant other at bed side.

## 2015-03-01 NOTE — ED Notes (Signed)
[facility] called for transportation 

## 2015-03-01 NOTE — Progress Notes (Signed)
Right radial TR band removed, site is a level 0, radial pulses +2 bilaterally & gauze dressing applied.  Patient instructed on arm/wrist movement restrictions for next 24 hours. Will continue to monitor. [place], Ardeth Sportsman

## 2015-03-01 NOTE — Progress Notes (Signed)
Troponin 16.36, post STEMI/post-cath.  Cards PA Tarri Fuller notified.  Patient stable w/ no complaints.  Will continue to monitor. [place], Ardeth Sportsman

## 2015-03-01 NOTE — Progress Notes (Signed)
Chaplain responded to a Code Stemi request on 2CRm 18.  The patient was being accessed by the Nursing Staff at and was awake and alert at my arrival. I introduced myself as Chaplain and was responded to the Code Stemi.  I presented to the patient and family present and offered words of encouragement, and prayer of comfort and healing, they were both appreciative of visit.  Chaplain will follow up as needed.  [place] [phone]

## 2015-03-01 NOTE — Progress Notes (Signed)
ANTICOAGULATION CONSULT NOTE - Initial Consult  Pharmacy Consult for Heparin Indication: chest pain/ACS  Allergies  Allergen Reactions  . Codeine Rash    Patient Measurements: Height: 6' (182.9 cm) Weight: 210 lb (95.255 kg) IBW/kg (Calculated) : 77.6 Heparin Dosing Weight: 95 kg  Vital Signs: Temp: 98.3 F (36.8 C) (08/28 0505) Temp Source: Oral (08/28 0505) BP: 139/72 mmHg (08/28 0505) Pulse Rate: 88 (08/28 0505)  Labs:  Recent Labs  03/01/15 0524 03/01/15 0546  HGB 15.0 16.0  HCT 43.2 47.0  PLT 196  --   CREATININE  --  0.70    Estimated Creatinine Clearance: 126.5 mL/min (by C-G formula based on Cr of 0.7).   Medical History: Past Medical History  Diagnosis Date  . Diabetes mellitus   . Hypertension     Medications:  Scheduled:  . aspirin EC  325 mg Oral NOW  . heparin  4,000 Units Intravenous Once   Infusions:  . heparin      Assessment:  54 yr male with chest pain, nauseated, diaphoretic, chest pressure, weak  Elevated troponin (21.33)  No H&P available at this time  On no oral anticoagulants prior to admission  Pharmacy consulted to dose IV heparin for ACS/STEMI  Goal of Therapy:  Heparin level 0.3-0.7 units/ml Monitor platelets by anticoagulation protocol: Yes   Plan:   Check baseline aPTT and PT/INR  Give heparin 4000 unit IV bolus x 1 followed by heparin infusion @ 1200 units/hr  Check heparin level 6 hr after heparin started  Check heparin level & CBC daily  Lakyn Alsteen, Toribio [place], PharmD 03/01/2015,5:49 AM

## 2015-03-01 NOTE — Progress Notes (Addendum)
ANTICOAGULATION CONSULT NOTE - Follow Up Consult  Pharmacy Consult for heparin Indication: chest pain/ACS  Allergies  Allergen Reactions  . Codeine Rash    Patient Measurements: Height: 6' (182.9 cm) Weight: 210 lb 5.1 oz (95.4 kg) IBW/kg (Calculated) : 77.6  Vital Signs: Temp: 98.9 F (37.2 C) (08/28 1145) Temp Source: Oral (08/28 1145) BP: 112/62 mmHg (08/28 1200) Pulse Rate: 92 (08/28 1200)  Labs:  Recent Labs  03/01/15 0524 03/01/15 0546  HGB 15.0 16.0  HCT 43.2 47.0  PLT 196  --   APTT  --  29  LABPROT  --  13.9  INR  --  1.05  CREATININE 0.67 0.70    Estimated Creatinine Clearance: 126.5 mL/min (by C-G formula based on Cr of 0.7).  Assessment: 54 yo m admitted with CP for several days, taken emergently to cath.  Found with 100% occluded RCA with several other stenoses. No interventions today, will need either CABG or another cath for PCI. Pharmacy is consulted to begin heparin 8 hours post sheath removal.  Sheath was removed at 1115, so will resume heparin at 1900 tonight. CBC stable.  Goal of Therapy:  Heparin level 0.3-0.7 units/ml Monitor platelets by anticoagulation protocol: Yes   Plan:  Begin heparin infusion at 1150 units/hr (~12 units/kg/hr) at 1900 tonight 6-hr HL @ 0100 8/29 Daily HL starting on 8/30 Monitor for bleeding  Jaclyne Haverstick L. Nicole Kindred, PharmD Clinical Pharmacy Resident Pager: [phone] 03/01/2015 12:28 PM

## 2015-03-01 NOTE — ED Provider Notes (Signed)
CSN: [id number]     Arrival date & time 03/01/15  0451 History   First MD Initiated Contact with Patient 03/01/15 [phone]     Chief Complaint  Patient presents with  . Chest Pain     (Consider location/radiation/quality/duration/timing/severity/associated sxs/prior Treatment) HPI 54 year old male presents to the emergency department from home with complaint of chest discomfort and nausea and vomiting.  Patient reports onset of pain Friday after mowing the lawn.  Pain has been intermittent since then.  Patient has had episodes of diaphoresis and shortness of breath.  At times the pain has been severe enough to make him vomit.  He has had 3-4 episodes of emesis.  Wife reports that on Friday she knows that he was belching more than usual.  Patient is a diabetic, has history of high blood pressure and high cholesterol.  He reports strong family history of coronary disease with MIs in multiple family members.  He denies previous heart issues, last EKG was done over 10 years ago.  No prior stress test.  Patient currently pain-free Past Medical History  Diagnosis Date  . Diabetes mellitus   . Hypertension    History reviewed. No pertinent past surgical history. Family History  Problem Relation Age of Onset  . Colon polyps Paternal Uncle   . Colon cancer Neg Hx    Social History  Substance Use Topics  . Smoking status: Never Smoker   . Smokeless tobacco: Never Used  . Alcohol Use: Yes     Comment: OCCASIONAL BEER,ONCE WEEK    Review of Systems   See History of Present Illness; otherwise all other systems are reviewed and negative  Allergies  Codeine  Home Medications   Prior to Admission medications   Medication Sig Start Date End Date Taking? Authorizing Provider  gabapentin (NEURONTIN) 600 MG tablet Take 600 mg by mouth 4 (four) times daily - after meals and at bedtime.   Yes Historical Provider, MD  glipiZIDE (GLUCOTROL) 10 MG tablet Take 10 mg by mouth Twice daily.  11/11/11  Yes  Historical Provider, MD  lisinopril (PRINIVIL,ZESTRIL) 2.5 MG tablet Take 2.5 mg by mouth Daily.  11/11/11  Yes Historical Provider, MD  metFORMIN (GLUCOPHAGE) 1000 MG tablet Take 1,000 mg by mouth Twice daily.  11/11/11  Yes Historical Provider, MD  rOPINIRole (REQUIP) 0.5 MG tablet Take 0.5 mg by mouth at bedtime.   Yes Historical Provider, MD  ACCU-CHEK AVIVA PLUS test strip  09/28/11   Historical Provider, MD   BP 139/72 mmHg  Pulse 88  Temp(Src) 98.3 F (36.8 C) (Oral)  Resp 20  Ht 6' (1.829 m)  Wt 210 lb (95.255 kg)  BMI 28.47 kg/m2  SpO2 98% Physical Exam  Constitutional: He is oriented to person, place, and time. He appears well-developed and well-nourished.  HENT:  Head: Normocephalic and atraumatic.  Nose: Nose normal.  Mouth/Throat: Oropharynx is clear and moist.  Eyes: Conjunctivae and EOM are normal. Pupils are equal, round, and reactive to light.  Neck: Normal range of motion. Neck supple. No JVD present. No tracheal deviation present. No thyromegaly present.  Cardiovascular: Normal rate, regular rhythm, normal heart sounds and intact distal pulses.  Exam reveals no gallop and no friction rub.   No murmur heard. Pulmonary/Chest: Effort normal and breath sounds normal. No stridor. No respiratory distress. He has no wheezes. He has no rales. He exhibits no tenderness.  Abdominal: Soft. Bowel sounds are normal. He exhibits no distension and no mass. There is no tenderness. There  is no rebound and no guarding.  Musculoskeletal: Normal range of motion. He exhibits no edema or tenderness.  Lymphadenopathy:    He has no cervical adenopathy.  Neurological: He is alert and oriented to person, place, and time. He displays normal reflexes. He exhibits normal muscle tone. Coordination normal.  Skin: Skin is warm and dry. No rash noted. No erythema. No pallor.  Psychiatric: He has a normal mood and affect. His behavior is normal. Judgment and thought content normal.  Nursing note and  vitals reviewed.   ED Course  Procedures (including critical care time) Labs Review Labs Reviewed  BASIC METABOLIC PANEL - Abnormal; Notable for the following:    Chloride 99 (*)    Glucose, Bld 266 (*)    All other components within normal limits  CBC - Abnormal; Notable for the following:    WBC 11.6 (*)    All other components within normal limits  HEPATIC FUNCTION PANEL - Abnormal; Notable for the following:    AST 215 (*)    Total Bilirubin 1.5 (*)    Indirect Bilirubin 1.3 (*)    All other components within normal limits  LIPASE, BLOOD - Abnormal; Notable for the following:    Lipase 15 (*)    All other components within normal limits  I-STAT TROPOININ, ED - Abnormal; Notable for the following:    Troponin i, poc 21.33 (*)    All other components within normal limits  I-STAT CHEM 8, ED - Abnormal; Notable for the following:    Chloride 97 (*)    BUN 21 (*)    Glucose, Bld 263 (*)    All other components within normal limits  PROTIME-INR  APTT  TROPONIN I  HEPARIN LEVEL (UNFRACTIONATED)    Imaging Review Dg Chest 2 View  03/01/2015   CLINICAL DATA:  Intermittent chest pain for 2 days.  EXAM: CHEST  2 VIEW  COMPARISON:  None.  FINDINGS: Lung volumes are low. There is bibasilar atelectasis. The cardiomediastinal contours are normal. The lungs are clear. Pulmonary vasculature is normal. No consolidation, pleural effusion, or pneumothorax. No acute osseous abnormalities are seen. There is degenerative change in the thoracic spine.  IMPRESSION: Hypoventilatory chest with bibasilar atelectasis.   Electronically Signed   By: Jeb Levering M.D.   On: 03/01/2015 06:00   I have personally reviewed and evaluated these images and lab results as part of my medical decision-making.   EKG Interpretation   Date/Time:  Sunday March 01 2015 05:00:46 EDT Ventricular Rate:  84 PR Interval:  234 QRS Duration: 95 QT Interval:  363 QTC Calculation: 429 R Axis:   66 Text  Interpretation:  Sinus rhythm Prolonged PR interval Borderline  repolarization abnormality Minimal ST elevation, inferior leads ST  depression septal leads, inferior t wave inversions No old tracing to  compare Confirmed by Adreanne Yono  MD, Merlen Gurry ([id number]) on 03/01/2015 5:13:02 AM     CRITICAL CARE Performed by: Kalman Drape Total critical care time: 30 min Critical care time was exclusive of separately billable procedures and treating other patients. Critical care was necessary to treat or prevent imminent or life-threatening deterioration. Critical care was time spent personally by me on the following activities: development of treatment plan with patient and/or surrogate as well as nursing, discussions with consultants, evaluation of patient's response to treatment, examination of patient, obtaining history from patient or surrogate, ordering and performing treatments and interventions, ordering and review of laboratory studies, ordering and review of radiographic studies, pulse oximetry  and re-evaluation of patient's condition.  MDM   Final diagnoses:  NSTEMI (non-ST elevated myocardial infarction)    54 year old male with chest pain intermittently for over 24 hours.  EKG with ST depressions in septal leads, inferior T-wave inversions and minimal ST elevation inferiorly.  There are some Q waves inferiorly.  Patient pain-free at this time.  Plan for full dose aspirin, lab work, chest x-ray.  Expect patient will need to be admitted, given risk factors.  5:45 AM Initial point-of-care troponin has returned greater than 20.  Patient to receive heparin.  Will discuss with on-call cardiologist.  Case d/w Dr Marlowe Sax with cardiology who accepts to stepdown at [facility].  Linton Flemings, MD 03/01/15 [phone]

## 2015-03-02 ENCOUNTER — Inpatient Hospital Stay (HOSPITAL_COMMUNITY): Payer: 59

## 2015-03-02 ENCOUNTER — Other Ambulatory Visit: Payer: Self-pay | Admitting: *Deleted

## 2015-03-02 ENCOUNTER — Encounter (HOSPITAL_COMMUNITY): Payer: Self-pay | Admitting: Interventional Cardiology

## 2015-03-02 DIAGNOSIS — I25110 Atherosclerotic heart disease of native coronary artery with unstable angina pectoris: Secondary | ICD-10-CM

## 2015-03-02 DIAGNOSIS — Z01810 Encounter for preprocedural cardiovascular examination: Secondary | ICD-10-CM

## 2015-03-02 DIAGNOSIS — R079 Chest pain, unspecified: Secondary | ICD-10-CM

## 2015-03-02 DIAGNOSIS — I214 Non-ST elevation (NSTEMI) myocardial infarction: Secondary | ICD-10-CM

## 2015-03-02 DIAGNOSIS — I25111 Atherosclerotic heart disease of native coronary artery with angina pectoris with documented spasm: Secondary | ICD-10-CM

## 2015-03-02 LAB — ABO/RH: ABO/RH(D): O POS

## 2015-03-02 LAB — COMPREHENSIVE METABOLIC PANEL
ALT: 30 U/L (ref 17–63)
AST: 58 U/L — ABNORMAL HIGH (ref 15–41)
Albumin: 3.2 g/dL — ABNORMAL LOW (ref 3.5–5.0)
Alkaline Phosphatase: 69 U/L (ref 38–126)
Anion gap: 8 (ref 5–15)
BUN: 15 mg/dL (ref 6–20)
CO2: 28 mmol/L (ref 22–32)
Calcium: 8.5 mg/dL — ABNORMAL LOW (ref 8.9–10.3)
Chloride: 98 mmol/L — ABNORMAL LOW (ref 101–111)
Creatinine, Ser: 0.76 mg/dL (ref 0.61–1.24)
GFR calc Af Amer: >60 mL/min (ref >60)
GFR calc non Af Amer: >60 mL/min (ref >60)
Glucose, Bld: 214 mg/dL — ABNORMAL HIGH (ref 65–99)
Potassium: 3.5 mmol/L (ref 3.5–5.1)
Sodium: 134 mmol/L — ABNORMAL LOW (ref 135–145)
Total Bilirubin: 1.4 mg/dL — ABNORMAL HIGH (ref 0.3–1.2)
Total Protein: 6 g/dL — ABNORMAL LOW (ref 6.5–8.1)

## 2015-03-02 LAB — URINALYSIS, ROUTINE W REFLEX MICROSCOPIC
Glucose, UA: >1000 mg/dL — AB
Hgb urine dipstick: NEGATIVE
Ketones, ur: >80 mg/dL — AB
Leukocytes, UA: NEGATIVE
Nitrite: NEGATIVE
Protein, ur: NEGATIVE mg/dL
Specific Gravity, Urine: 1.037 — ABNORMAL HIGH (ref 1.005–1.030)
Urobilinogen, UA: 2 mg/dL — ABNORMAL HIGH (ref 0.0–1.0)
pH: 6 (ref 5.0–8.0)

## 2015-03-02 LAB — BLOOD GAS, ARTERIAL
Acid-Base Excess: 1.1 mmol/L (ref 0.0–2.0)
Bicarbonate: 24.7 mEq/L — ABNORMAL HIGH (ref 20.0–24.0)
Drawn by: 275531
FIO2: 0.21
O2 Saturation: 94.7 %
Patient temperature: 98.6
TCO2: 25.8 mmol/L (ref 0–100)
pCO2 arterial: 35.9 mmHg (ref 35.0–45.0)
pH, Arterial: 7.451 — ABNORMAL HIGH (ref 7.350–7.450)
pO2, Arterial: 60.7 mmHg — ABNORMAL LOW (ref 80.0–100.0)

## 2015-03-02 LAB — SPIROMETRY WITH GRAPH
FEF 25-75 Post: 1.56 L/sec
FEF 25-75 Pre: 0.88 L/sec
FEF2575-%Change-Post: 76 %
FEF2575-%Pred-Post: 45 %
FEF2575-%Pred-Pre: 25 %
FEV1-%Change-Post: 9 %
FEV1-%Pred-Post: 36 %
FEV1-%Pred-Pre: 32 %
FEV1-Post: 1.46 L
FEV1-Pre: 1.34 L
FEV1FVC-%Change-Post: -4 %
FEV1FVC-%Pred-Pre: 108 %
FEV6-%Change-Post: 15 %
FEV6-%Pred-Post: 35 %
FEV6-%Pred-Pre: 31 %
FEV6-Post: 1.83 L
FEV6-Pre: 1.58 L
FEV6FVC-%Change-Post: 1 %
FEV6FVC-%Pred-Post: 104 %
FEV6FVC-%Pred-Pre: 103 %
FVC-%Change-Post: 14 %
FVC-%Pred-Post: 34 %
FVC-%Pred-Pre: 30 %
FVC-Post: 1.83 L
FVC-Pre: 1.6 L
Post FEV1/FVC ratio: 80 %
Post FEV6/FVC ratio: 100 %
Pre FEV1/FVC ratio: 83 %
Pre FEV6/FVC Ratio: 99 %

## 2015-03-02 LAB — LIPID PANEL
CHOLESTEROL: 145 mg/dL (ref 0–200)
HDL: 44 mg/dL (ref >40)
LDL CALC: 85 mg/dL (ref 0–99)
TRIGLYCERIDES: 82 mg/dL (ref <150)
Total CHOL/HDL Ratio: 3.3 RATIO
VLDL: 16 mg/dL (ref 0–40)

## 2015-03-02 LAB — CBC
HEMATOCRIT: 39.1 % (ref 39.0–52.0)
Hemoglobin: 13.6 g/dL (ref 13.0–17.0)
MCH: 29.8 pg (ref 26.0–34.0)
MCHC: 34.8 g/dL (ref 30.0–36.0)
MCV: 85.7 fL (ref 78.0–100.0)
Platelets: 168 10*3/uL (ref 150–400)
RBC: 4.56 MIL/uL (ref 4.22–5.81)
RDW: 13.2 % (ref 11.5–15.5)
WBC: 12.8 10*3/uL — ABNORMAL HIGH (ref 4.0–10.5)

## 2015-03-02 LAB — BASIC METABOLIC PANEL
ANION GAP: 7 (ref 5–15)
BUN: 13 mg/dL (ref 6–20)
CALCIUM: 8.5 mg/dL — AB (ref 8.9–10.3)
CO2: 27 mmol/L (ref 22–32)
Chloride: 101 mmol/L (ref 101–111)
Creatinine, Ser: 0.69 mg/dL (ref 0.61–1.24)
GLUCOSE: 217 mg/dL — AB (ref 65–99)
POTASSIUM: 3.5 mmol/L (ref 3.5–5.1)
Sodium: 135 mmol/L (ref 135–145)

## 2015-03-02 LAB — GLUCOSE, CAPILLARY
GLUCOSE-CAPILLARY: 232 mg/dL — AB (ref 65–99)
GLUCOSE-CAPILLARY: 198 mg/dL — AB (ref 65–99)
GLUCOSE-CAPILLARY: 214 mg/dL — AB (ref 65–99)
Glucose-Capillary: 200 mg/dL — ABNORMAL HIGH (ref 65–99)

## 2015-03-02 LAB — URINE MICROSCOPIC-ADD ON

## 2015-03-02 LAB — SURGICAL PCR SCREEN
MRSA, PCR: NEGATIVE
Staphylococcus aureus: POSITIVE — AB

## 2015-03-02 LAB — HEMOGLOBIN A1C
Hgb A1c MFr Bld: 8.3 % — ABNORMAL HIGH (ref 4.8–5.6)
MEAN PLASMA GLUCOSE: 192 mg/dL

## 2015-03-02 LAB — HEPARIN LEVEL (UNFRACTIONATED)
HEPARIN UNFRACTIONATED: 0.17 [IU]/mL — AB (ref 0.30–0.70)
Heparin Unfractionated: <0.1 IU/mL — ABNORMAL LOW (ref 0.30–0.70)
Heparin Unfractionated: 0.13 IU/mL — ABNORMAL LOW (ref 0.30–0.70)

## 2015-03-02 MED ORDER — DEXTROSE 5 % IV SOLN
30.0000 ug/min | INTRAVENOUS | Status: DC
  Administered 2015-03-03: 15 ug/min via INTRAVENOUS
  Filled 2015-03-02: qty 2

## 2015-03-02 MED ORDER — NITROGLYCERIN IN D5W 200-5 MCG/ML-% IV SOLN
2.0000 ug/min | INTRAVENOUS | Status: DC
  Filled 2015-03-02: qty 250

## 2015-03-02 MED ORDER — HEPARIN (PORCINE) IN NACL 100-0.45 UNIT/ML-% IJ SOLN
2100.0000 [IU]/h | INTRAMUSCULAR | Status: DC
  Filled 2015-03-02 (×2): qty 250

## 2015-03-02 MED ORDER — METOPROLOL TARTRATE 12.5 MG HALF TABLET
12.5000 mg | ORAL_TABLET | Freq: Once | ORAL | Status: AC
  Administered 2015-03-03: 12.5 mg via ORAL
  Filled 2015-03-02: qty 1

## 2015-03-02 MED ORDER — DOPAMINE-DEXTROSE 3.2-5 MG/ML-% IV SOLN
0.0000 ug/kg/min | INTRAVENOUS | Status: AC
  Administered 2015-03-03: 3 ug/kg/min via INTRAVENOUS
  Filled 2015-03-02: qty 250

## 2015-03-02 MED ORDER — ALBUTEROL SULFATE (2.5 MG/3ML) 0.083% IN NEBU
2.5000 mg | INHALATION_SOLUTION | Freq: Once | RESPIRATORY_TRACT | Status: AC
  Administered 2015-03-02: 2.5 mg via RESPIRATORY_TRACT

## 2015-03-02 MED ORDER — HEPARIN SODIUM (PORCINE) 1000 UNIT/ML IJ SOLN
INTRAMUSCULAR | Status: DC
  Filled 2015-03-02: qty 30

## 2015-03-02 MED ORDER — DEXTROSE 5 % IV SOLN
1.5000 g | INTRAVENOUS | Status: AC
  Administered 2015-03-03: 1.5 g via INTRAVENOUS
  Administered 2015-03-03: .75 g via INTRAVENOUS
  Filled 2015-03-02: qty 1.5

## 2015-03-02 MED ORDER — PAPAVERINE HCL 30 MG/ML IJ SOLN
INTRAMUSCULAR | Status: AC
  Administered 2015-03-03: 500 mL
  Filled 2015-03-02: qty 2.5

## 2015-03-02 MED ORDER — CHLORHEXIDINE GLUCONATE 4 % EX LIQD
60.0000 mL | Freq: Once | CUTANEOUS | Status: AC
  Administered 2015-03-03: 4 via TOPICAL

## 2015-03-02 MED ORDER — VANCOMYCIN HCL 10 G IV SOLR
1500.0000 mg | INTRAVENOUS | Status: DC
  Filled 2015-03-02: qty 1500

## 2015-03-02 MED ORDER — CHLORHEXIDINE GLUCONATE 4 % EX LIQD
60.0000 mL | Freq: Once | CUTANEOUS | Status: AC
  Administered 2015-03-02: 4 via TOPICAL
  Filled 2015-03-02: qty 60

## 2015-03-02 MED ORDER — BISACODYL 5 MG PO TBEC
5.0000 mg | DELAYED_RELEASE_TABLET | Freq: Once | ORAL | Status: AC
  Administered 2015-03-02: 5 mg via ORAL
  Filled 2015-03-02: qty 1

## 2015-03-02 MED ORDER — DEXMEDETOMIDINE HCL IN NACL 400 MCG/100ML IV SOLN
0.1000 ug/kg/h | INTRAVENOUS | Status: AC
  Administered 2015-03-03: .4 ug/kg/h via INTRAVENOUS
  Filled 2015-03-02: qty 100

## 2015-03-02 MED ORDER — DEXTROSE 5 % IV SOLN
750.0000 mg | INTRAVENOUS | Status: DC
  Filled 2015-03-02 (×2): qty 750

## 2015-03-02 MED ORDER — TEMAZEPAM 15 MG PO CAPS: 15.0000 mg | ORAL_CAPSULE | Freq: Once | ORAL | Status: DC | PRN

## 2015-03-02 MED ORDER — INSULIN GLARGINE 100 UNIT/ML ~~LOC~~ SOLN
14.0000 [IU] | Freq: Every day | SUBCUTANEOUS | Status: DC
  Administered 2015-03-02: 14 [IU] via SUBCUTANEOUS
  Filled 2015-03-02 (×2): qty 0.14

## 2015-03-02 MED ORDER — POTASSIUM CHLORIDE 2 MEQ/ML IV SOLN
80.0000 meq | INTRAVENOUS | Status: DC
  Filled 2015-03-02: qty 40

## 2015-03-02 MED ORDER — EPINEPHRINE HCL 1 MG/ML IJ SOLN
0.0000 ug/min | INTRAVENOUS | Status: DC
  Filled 2015-03-02: qty 4

## 2015-03-02 MED ORDER — METOCLOPRAMIDE HCL 5 MG/ML IJ SOLN
10.0000 mg | Freq: Four times a day (QID) | INTRAMUSCULAR | Status: DC
  Administered 2015-03-02 – 2015-03-03 (×4): 10 mg via INTRAVENOUS
  Filled 2015-03-02 (×4): qty 2

## 2015-03-02 MED ORDER — MAGNESIUM SULFATE 50 % IJ SOLN
40.0000 meq | INTRAMUSCULAR | Status: DC
  Filled 2015-03-02: qty 10

## 2015-03-02 MED ORDER — MUPIROCIN CALCIUM 2 % EX CREA
TOPICAL_CREAM | Freq: Two times a day (BID) | CUTANEOUS | Status: DC
  Administered 2015-03-02: 20:00:00 via TOPICAL
  Filled 2015-03-02: qty 15

## 2015-03-02 MED ORDER — SODIUM CHLORIDE 0.9 % IV SOLN
INTRAVENOUS | Status: DC
  Filled 2015-03-02: qty 40

## 2015-03-02 MED ORDER — SODIUM CHLORIDE 0.9 % IV SOLN
INTRAVENOUS | Status: DC
  Administered 2015-03-03: 7.4 [IU]/h via INTRAVENOUS
  Filled 2015-03-02: qty 2.5

## 2015-03-02 MED ORDER — MUPIROCIN 2 % EX OINT
TOPICAL_OINTMENT | CUTANEOUS | Status: AC
  Filled 2015-03-02: qty 22

## 2015-03-02 NOTE — Progress Notes (Signed)
Pt c/o 6/10 CP while echo was being performed. EKG done. Showed ST elevation in V5, V6 and II. MD notified, did not receive call back. PA notified. PA assessed pt at bedside and notified MD. Pt is currently CP free since nitro gtt has been increased to 73mcg. Surgeon is at the bedside. Will continue to monitor closely. Orders received for CABG in the morning.

## 2015-03-02 NOTE — Progress Notes (Signed)
[phone] Pt sleepy. Discussed importance of mobility and IS after surgery. Discussed sternal precautions. Gave pt OHS booklet, care guide and wrote how to view pre op video. Gave IS and pt only able to get to 559ml as he is sore in chest.  Discussed purpose of IS and walking to help pulmonary function. Did not walk as noticed pt has had some CP today and for surgery tomorrow. Will follow up after surgery. Graylon Good RN BSN 03/02/2015 1:56 PM

## 2015-03-02 NOTE — Progress Notes (Signed)
ANTICOAGULATION CONSULT NOTE - Follow Up Consult  Pharmacy Consult for heparin Indication: CAD awaiting PCI vs CABG   Labs:  Recent Labs  03/01/15 0524 03/01/15 0546 03/01/15 1341 03/01/15 1843 03/01/15 2247 03/02/15 0119  HGB 15.0 16.0  --   --   --  13.6  HCT 43.2 47.0  --   --   --  39.1  PLT 196  --   --   --   --  168  APTT  --  29  --   --   --   --   LABPROT  --  13.9  --   --   --   --   INR  --  1.05  --   --   --   --   HEPARINUNFRC  --   --   --   --   --  <0.10*  CREATININE 0.67 0.70  --   --   --  0.69  TROPONINI  --   --  16.36* 9.76* 11.54*  --      Assessment: 54yo male undetectable on heparin with initial dosing post-cath; Hgb has dropped some but RN notes no overt signs of bleeding.  Goal of Therapy:  Heparin level 0.3-0.7 units/ml   Plan:  Will increase heparin gtt by 4 units/kg/hr to 1500 units/hr and check level in 6hr.  Wynona Neat, PharmD, BCPS  03/02/2015,2:56 AM

## 2015-03-02 NOTE — Progress Notes (Signed)
Pre-op Cardiac Surgery  Carotid Findings:  1-39% ICA stenosis.  Vertebral artery flow antegrade.  Upper Extremity Right Left  Brachial Pressures Not yet done   Radial Waveforms    Ulnar Waveforms    Palmar Arch (Allen's Test)     Findings:      Lower  Extremity Right Left  Dorsalis Pedis Not yet done   Anterior Tibial    Posterior Tibial    Ankle/Brachial Indices      Findings:

## 2015-03-02 NOTE — Progress Notes (Signed)
SUBJECTIVE:  Still with mild residual chest pain.  No SOB.    PHYSICAL EXAM Filed Vitals:   03/01/15 2313 03/02/15 0000 03/02/15 0317 03/02/15 0400  BP: 98/63 100/57 112/62 112/61  Pulse: 79 77 80 80  Temp: 99.8 F (37.7 C)  99.3 F (37.4 C)   TempSrc: Oral  Oral   Resp: 21 20 21 21   Height:      Weight:      SpO2: 96% 95% 99% 96%   General:  No distress Lungs:  Clear Heart:   RRR, no rub Abdomen:  Positive bowel sounds, no rebound no guarding Extremities:  Right wrist cath site OK.   Neuro:  Nonfocal  LABS: Lab Results  Component Value Date   TROPONINI 11.54* 03/01/2015   Results for orders placed or performed during the hospital encounter of 03/01/15 (from the past 24 hour(s))  MRSA PCR Screening     Status: None   Collection Time: 03/01/15  8:16 AM  Result Value Ref Range   MRSA by PCR NEGATIVE NEGATIVE  Glucose, capillary     Status: Abnormal   Collection Time: 03/01/15  8:46 AM  Result Value Ref Range   Glucose-Capillary 217 (H) 65 - 99 mg/dL   Comment 1 Notify RN   Glucose, capillary     Status: Abnormal   Collection Time: 03/01/15 11:51 AM  Result Value Ref Range   Glucose-Capillary 207 (H) 65 - 99 mg/dL  Troponin I     Status: Abnormal   Collection Time: 03/01/15  1:41 PM  Result Value Ref Range   Troponin I 16.36 (HH) <0.031 ng/mL  TSH     Status: None   Collection Time: 03/01/15  1:41 PM  Result Value Ref Range   TSH 0.678 0.350 - 4.500 uIU/mL  Glucose, capillary     Status: Abnormal   Collection Time: 03/01/15  4:05 PM  Result Value Ref Range   Glucose-Capillary 189 (H) 65 - 99 mg/dL   Comment 1 Capillary Specimen   Troponin I     Status: Abnormal   Collection Time: 03/01/15  6:43 PM  Result Value Ref Range   Troponin I 9.76 (HH) <0.031 ng/mL  Glucose, capillary     Status: Abnormal   Collection Time: 03/01/15  9:12 PM  Result Value Ref Range   Glucose-Capillary 186 (H) 65 - 99 mg/dL   Comment 1 Capillary Specimen   Troponin I      Status: Abnormal   Collection Time: 03/01/15 10:47 PM  Result Value Ref Range   Troponin I 11.54 (HH) <0.031 ng/mL  Heparin level (unfractionated)     Status: Abnormal   Collection Time: 03/02/15  1:19 AM  Result Value Ref Range   Heparin Unfractionated <0.10 (L) 0.30 - 0.70 IU/mL  CBC     Status: Abnormal   Collection Time: 03/02/15  1:19 AM  Result Value Ref Range   WBC 12.8 (H) 4.0 - 10.5 K/uL   RBC 4.56 4.22 - 5.81 MIL/uL   Hemoglobin 13.6 13.0 - 17.0 g/dL   HCT 39.1 39.0 - 52.0 %   MCV 85.7 78.0 - 100.0 fL   MCH 29.8 26.0 - 34.0 pg   MCHC 34.8 30.0 - 36.0 g/dL   RDW 13.2 11.5 - 15.5 %   Platelets 168 150 - 400 K/uL  Basic metabolic panel     Status: Abnormal   Collection Time: 03/02/15  1:19 AM  Result Value Ref Range   Sodium 135 135 -  145 mmol/L   Potassium 3.5 3.5 - 5.1 mmol/L   Chloride 101 101 - 111 mmol/L   CO2 27 22 - 32 mmol/L   Glucose, Bld 217 (H) 65 - 99 mg/dL   BUN 13 6 - 20 mg/dL   Creatinine, Ser 0.69 0.61 - 1.24 mg/dL   Calcium 8.5 (L) 8.9 - 10.3 mg/dL   GFR calc non Af Amer >60 >60 mL/min   GFR calc Af Amer >60 >60 mL/min   Anion gap 7 5 - 15  Lipid panel     Status: None   Collection Time: 03/02/15  1:19 AM  Result Value Ref Range   Cholesterol 145 0 - 200 mg/dL   Triglycerides 82 <150 mg/dL   HDL 44 >40 mg/dL   Total CHOL/HDL Ratio 3.3 RATIO   VLDL 16 0 - 40 mg/dL   LDL Cholesterol 85 0 - 99 mg/dL    Intake/Output Summary (Last 24 hours) at 03/02/15 0745 Last data filed at 03/02/15 0600  Gross per 24 hour  Intake 1671.06 ml  Output   1125 ml  Net 546.06 ml   CATH:   Mid RCA to Dist RCA lesion, 75% stenosed. 1st Mrg lesion, 40% stenosed. Prox LAD to Mid LAD lesion, 90% stenosed. 1st Diag lesion, 70% stenosed.    Post Atrio lesion, 100% stenosed.  ASSESSMENT AND PLAN:  NSTEMI:  I have reviewed the cath films myself and I agree that CABG would be the best option for the LAD, diag stenosis.   I called [facility] today.  I will continue the IV  NTG and heparin for now given the residual discomfort. Echo ordered.    HTN:  BP OK.  Continue current therapy.   DM:  Check an A1C.  Continue current therapy.    DYSLIPIDEMIA:  LDL is 85.  Now on Zocor.     Jeneen Rinks [facility] 03/02/2015 7:45 AM

## 2015-03-02 NOTE — Progress Notes (Signed)
Pre-op Cardiac Surgery  Carotid Findings:  1-39% ICA stenosis.  Vertebral artery flow antegrade.  Sharion Dove, RVS 03/02/2015 12:30 AM  Upper Extremity Right Left  Brachial Pressures 120  Triphasic  119  Triphasic   Radial Waveforms Triphasic  Triphasic   Ulnar Waveforms Triphasic  Triphasic   Palmar Arch (Allen's Test) Doppler obliterates with radial compression, normal with ulnar compression. Within normal limits       Lower  Extremity Right Left  Dorsalis Pedis 141  Triphasic  132  Triphasic   Anterior Tibial    Posterior Tibial 149  Triphasic  144  Triphasic   Ankle/Brachial Indices 1.24 1.2    Ralene Cork, RVT 03/02/2015 9:51 PM

## 2015-03-02 NOTE — Progress Notes (Signed)
Inpatient Diabetes Program Recommendations  AACE/ADA: New Consensus Statement on Inpatient Glycemic Control (2013)  Target Ranges:  Prepandial:   less than 140 mg/dL      Peak postprandial:   less than 180 mg/dL (1-2 hours)      Critically ill patients:  140 - 180 mg/dL   Inpatient Diabetes Program Recommendations Correction (SSI): increase to Moderate scale  HgbA1C: pending  Consider discontinuing Glipizide while hospitalized.  Risk of hypoglycemia if patient is not eating well or is made NPO. Will follow. Thank you  Raoul Pitch BSN, RN,CDE Inpatient Diabetes Coordinator [phone] (team pager)

## 2015-03-02 NOTE — Progress Notes (Signed)
[address]       [place] [address]             [phone]        Joel Berger Medical Record #[id number] Date of Birth: 02-14-1961  Referring: No ref. provider found Primary Care: Christ Kick, MD  Chief Complaint:    Chief Complaint  Patient presents with  . Chest Pain   acute MI-non-ST elevation  History of Present Illness:     Patient examined, cardiac catheterization in 2-D echocardiogram reviewed. 54 year old Caucasian male diabetic nonsmoker with strong family history of CAD and myocardial infarction at early age. He was admitted through the emergency department over the weekend with 2-3 days of nausea weakness bloating and then chest pain. Cardiac enzymes were positive and he had some ST segment changes. Emergency cardiac catheterization was performed. He had severe three-vessel coronary disease with occlusion of the distal RCA posterior lateral branch. He was treated with heparin and nitroglycerin. Today a consultation was placed to the CT surgery office for surgical revascularization. The patient has had persistent intermittent chest pains. His cardiac enzymes are trending down. He is hemodynamically stable. He has no valvular insufficiency on this echocardiogram.   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 He lives with his family. He was doing yard work over the weekend when he developed symptoms of his myocardial infarction.   Zubrod Score: At the time of surgery this patient's most appropriate activity status/level should be described as: []     0    Normal activity, no symptoms [x]     1    Restricted in physical strenuous activity but ambulatory, able to do out light work []     2    Ambulatory and capable of self care, unable to do work activities, up and about                 more than 50%  Of the time                            []     3    Only limited self care, in bed  greater than 50% of waking hours []     4    Completely disabled, no self care, confined to bed or chair []     5    Moribund  Past Medical History  Diagnosis Date  . Diabetes mellitus   . Hypertension     Past Surgical History  Procedure Laterality Date  . Cardiac catheterization N/A 03/01/2015    Procedure: Left Heart Cath and Coronary Angiography;  Surgeon: Belva Crome, MD;  Location: [facility];  Service: Cardiovascular;  Laterality: N/A;    History  Smoking status  . Never Smoker   Smokeless tobacco  . Never Used    History  Alcohol Use  . Yes    Comment: OCCASIONAL BEER,ONCE WEEK    Social History   Social History  . Marital Status: Single    Spouse Name: N/A  . Number of Children: N/A  . Years of Education: N/A   Occupational History  . Not on file.   Social History Main Topics  . Smoking status: Never Smoker   . Smokeless tobacco: Never Used  . Alcohol Use: Yes     Comment: OCCASIONAL BEER,ONCE WEEK  . Drug Use: No  .  Sexual Activity: Not on file   Other Topics Concern  . Not on file   Social History Narrative    Allergies  Allergen Reactions  . Codeine Rash  . Phenergan [Promethazine Hcl] Anxiety    Patient becomes restless & anxious    Current Facility-Administered Medications  Medication Dose Route Frequency Provider Last Rate Last Dose  . 0.9 %  sodium chloride infusion  250 mL Intravenous PRN Belva Crome, MD      . acetaminophen (TYLENOL) tablet 650 mg  650 mg Oral Q4H PRN Belva Crome, MD      . aspirin EC tablet 81 mg  81 mg Oral Daily Brett Canales, PA-C   81 mg at 03/02/15 0912  . gabapentin (NEURONTIN) tablet 600 mg  600 mg Oral TID PC & HS Brett Canales, PA-C   600 mg at 03/02/15 1306  . glipiZIDE (GLUCOTROL) tablet 10 mg  10 mg Oral BID AC Brett Canales, PA-C   10 mg at 03/02/15 0631  . heparin ADULT infusion 100 units/mL (25000 units/250 mL)  1,800 Units/hr Intravenous Continuous Sueanne Margarita, MD 18 mL/hr at 03/02/15  1100 1,800 Units/hr at 03/02/15 1100  . insulin aspart (novoLOG) injection 0-9 Units  0-9 Units Subcutaneous TID WC Brett Canales, PA-C   3 Units at 03/02/15 1307  . insulin glargine (LANTUS) injection 14 Units  14 Units Subcutaneous Daily Ivin Poot, MD   14 Units at 03/02/15 1307  . lisinopril (PRINIVIL,ZESTRIL) tablet 2.5 mg  2.5 mg Oral Daily Brett Canales, PA-C   2.5 mg at 03/02/15 0912  . metoCLOPramide (REGLAN) injection 10 mg  10 mg Intravenous 4 times per day Ivin Poot, MD   10 mg at 03/02/15 1306  . metoprolol tartrate (LOPRESSOR) tablet 12.5 mg  12.5 mg Oral BID Brett Canales, PA-C   12.5 mg at 03/02/15 2595  . nitroGLYCERIN 50 mg in dextrose 5 % 250 mL (0.2 mg/mL) infusion  5-200 mcg/min Intravenous Titrated Brett Canales, PA-C 15 mL/hr at 03/02/15 1145 50 mcg/min at 03/02/15 1145  . ondansetron (ZOFRAN) injection 4 mg  4 mg Intravenous Q6H PRN Brett Canales, PA-C   4 mg at 03/01/15 2152  . promethazine (PHENERGAN) injection 25 mg  25 mg Intravenous Q6H PRN Brett Canales, PA-C   25 mg at 03/01/15 1645  . rOPINIRole (REQUIP) tablet 0.5 mg  0.5 mg Oral QHS Brett Canales, PA-C   0.5 mg at 03/01/15 2108  . simvastatin (ZOCOR) tablet 40 mg  40 mg Oral q1800 Sueanne Margarita, MD   40 mg at 03/01/15 1722  . sodium chloride 0.9 % injection 3 mL  3 mL Intravenous Q12H Belva Crome, MD   3 mL at 03/01/15 2109  . sodium chloride 0.9 % injection 3 mL  3 mL Intravenous PRN Belva Crome, MD        Prescriptions prior to admission  Medication Sig Dispense Refill Last Dose  . gabapentin (NEURONTIN) 600 MG tablet Take 600 mg by mouth 4 (four) times daily - after meals and at bedtime.   02/28/2015 at Unknown time  . glipiZIDE (GLUCOTROL) 10 MG tablet Take 10 mg by mouth Twice daily.    02/28/2015 at Unknown time  . lisinopril (PRINIVIL,ZESTRIL) 2.5 MG tablet Take 2.5 mg by mouth Daily.    02/28/2015 at Unknown time  . metFORMIN (GLUCOPHAGE) 1000 MG tablet Take 1,000 mg by mouth Twice daily.  02/28/2015 at Unknown time  . rOPINIRole (REQUIP) 0.5 MG tablet Take 0.5 mg by mouth at bedtime.   Past Week at Unknown time  . ACCU-CHEK AVIVA PLUS test strip    12-24-11    Family History  Problem Relation Age of Onset  . Colon polyps Paternal Uncle   . Colon cancer Neg Hx      Review of Systems:       Cardiac Review of Systems: Y or N  Chest Pain [  y  ]  Resting SOB [n   ] Exertional SOB  [n  ]  Vertell Limber Florencio.Farrier  ]   Pedal Edema [ n  ]    Palpitationsy Blue.Reese  ] Syncope  [  n]   Presyncope [n   ]  General Review of Systems: [Y] = yes [  ]=no Constitional: recent weight change Florencio.Farrier  ]; anorexia [ n ]; fatigue Blue.Reese  ]; nausea [  y]; night sweats [ n ]; fever [ n ]; or chills [ n ]                                                               Dental: poor dentition[  ]; Last Dentist visit:12 mos   Eye : blurred vision [  ]; diplopia [   ]; vision changes [  ];  Amaurosis fugax[  ]; Resp: cough [  ];  wheezing[  ];  hemoptysis[  ]; shortness of breath[  ]; paroxysmal nocturnal dyspnea[  ]; dyspnea on exertion[  ]; or orthopnea[  ];  GI:  gallstones[] , vomiting[y  ];  dysphagia[  ]; melena[  ];  hematochezia [  ]; heartburn[y  ];   Hx of  Colonoscopy[  ]; GU: kidney stones [  ]; hematuria[  ];   dysuria [  ];  nocturia[  ];  history of     obstruction [  ]; urinary frequency [  ]             Skin: rash, swelling[  ];, hair loss[  ];  peripheral edema[  ];  or itching[  ]; Musculosketetal: myalgias[  ];  joint swelling[  ];  joint erythema[  ];  joint pain[  ];  back pain[  ];  Heme/Lymph: bruising[  ];  bleeding[  ];  anemia[  ];  Neuro: TIA[  ];  headaches[  ];  stroke[  ];  vertigo[  ];  seizures[  ];   paresthesias[  ];  difficulty walking[  ];  Psych:depression[  ]; anxiety[  ];  Endocrine: diabetes[7  ];  thyroid dysfunction[  ];  Immunizations: Flu [  ]; Pneumococcal[  ];  Other:  Physical Exam: BP 105/46 mmHg  Pulse 65  Temp(Src) 99.3 F (37.4 C) (Oral)  Resp 28  Ht 6' (1.829 m)  Wt  210 lb 5.1 oz (95.4 kg)  BMI 28.52 kg/m2  SpO2 98%      Physical Exam  General: Middle-aged Caucasian male in the CCU no acute distress accompanied by family HEENT: Normocephalic pupils equal , dentition adequate Neck: Supple without JVD, adenopathy, or bruit Chest: Clear to auscultation, symmetrical breath sounds, no rhonchi, no tenderness             or deformity Cardiovascular: Regular rate  and rhythm, no murmur, no gallop, peripheral pulses             palpable in all extremities Abdomen:  Soft, nontender, no palpable mass or organomegaly Extremities: Warm, well-perfused, no clubbing cyanosis edema or tenderness,              no venous stasis changes of the legs Rectal/GU: Deferred Neuro: Grossly non--focal and symmetrical throughout Skin: Clean and dry without rash or ulceration    Diagnostic Studies & Laboratory data:     Recent Radiology Findings:   Dg Chest 2 View  03/01/2015   CLINICAL DATA:  Intermittent chest pain for 2 days.  EXAM: CHEST  2 VIEW  COMPARISON:  None.  FINDINGS: Lung volumes are low. There is bibasilar atelectasis. The cardiomediastinal contours are normal. The lungs are clear. Pulmonary vasculature is normal. No consolidation, pleural effusion, or pneumothorax. No acute osseous abnormalities are seen. There is degenerative change in the thoracic spine.  IMPRESSION: Hypoventilatory chest with bibasilar atelectasis.   Electronically Signed   By: Jeb Levering M.D.   On: 03/01/2015 06:00     I have independently reviewed the above radiologic studies.  Recent Lab Findings: Lab Results  Component Value Date   WBC 12.8* 03/02/2015   HGB 13.6 03/02/2015   HCT 39.1 03/02/2015   PLT 168 03/02/2015   GLUCOSE 217* 03/02/2015   CHOL 145 03/02/2015   TRIG 82 03/02/2015   HDL 44 03/02/2015   LDLCALC 85 03/02/2015   ALT 52 03/01/2015   AST 215* 03/01/2015   NA 135 03/02/2015   K 3.5 03/02/2015   CL 101 03/02/2015   CREATININE 0.69 03/02/2015   BUN 13  03/02/2015   CO2 27 03/02/2015   TSH 0.678 03/01/2015   INR 1.05 03/01/2015   HGBA1C 8.3* 03/01/2015      Assessment / Plan:     Acute inferior MI   Severe multivessel coronary artery disease    Poorly controlled diabetes    Hypertension  Patient will be prepared for multivessel surgical coronary artery bypass grafting in the a.m. I've discussed the indications benefits and alternatives with the patient. I reviewed the risks to him of CABG. He demonstrates his understanding and agrees to proceed with surgery.     @ME1 @ 03/02/2015 1:25 PM

## 2015-03-02 NOTE — Progress Notes (Signed)
Echocardiogram 2D Echocardiogram has been performed.  Joel Berger 03/02/2015, 12:20 PM

## 2015-03-02 NOTE — Progress Notes (Addendum)
ANTICOAGULATION CONSULT NOTE - Follow Up Consult  Pharmacy Consult for heparin Indication: NSTEMI  Allergies  Allergen Reactions  . Codeine Rash  . Phenergan [Promethazine Hcl] Anxiety    Patient becomes restless & anxious    Patient Measurements: Height: 6' (182.9 cm) Weight: 210 lb 5.1 oz (95.4 kg) IBW/kg (Calculated) : 77.6  Heparin wt: 95.4 kg  Vital Signs: Temp: 98.7 F (37.1 C) (08/29 0700) Temp Source: Oral (08/29 0700) BP: 90/70 mmHg (08/29 1015) Pulse Rate: 85 (08/29 1015)  Labs:  Recent Labs  03/01/15 0524 03/01/15 0546 03/01/15 1341 03/01/15 1843 03/01/15 2247 03/02/15 0119 03/02/15 0905  HGB 15.0 16.0  --   --   --  13.6  --   HCT 43.2 47.0  --   --   --  39.1  --   PLT 196  --   --   --   --  168  --   APTT  --  29  --   --   --   --   --   LABPROT  --  13.9  --   --   --   --   --   INR  --  1.05  --   --   --   --   --   HEPARINUNFRC  --   --   --   --   --  <0.10* 0.13*  CREATININE 0.67 0.70  --   --   --  0.69  --   TROPONINI  --   --  16.36* 9.76* 11.54*  --   --     Estimated Creatinine Clearance: 126.5 mL/min (by C-G formula based on Cr of 0.69).  Assessment: 54 yo man admitted 03/01/2015 for NSTEMI, found to have multivessel disease. Awaiting CVTS consult for CABG. Pharmacy consulted to dose heparin.  PMH HTN, DM   Hgb 13.6, plt wnl. HL 0.13, subtherapeutic. Given drop in Hgb (no bleeding noted), will increase gtt without giving bolus.   Goal of Therapy:  Heparin level 0.3-0.7 units/ml Monitor platelets by anticoagulation protocol: Yes   Plan:  Increase heparin to 1800 units/h HL at 1700 Daily HL & CBC Monitor s/sx bleeding   Heloise Ochoa, Pharm.D. PGY2 Cardiology Pharmacy Resident Pager: [phone] 03/02/2015 12:18 PM   Addendum  -Heparin level SUBtherapeutic -No s/sx bleeding -Will increase rate to 2100 units/hr -CABG in am     Harvel Quale  03/02/2015 5:48 PM

## 2015-03-03 ENCOUNTER — Inpatient Hospital Stay (HOSPITAL_COMMUNITY)
Admit: 2015-03-03 | Discharge: 2015-03-03 | Disposition: A | Discharge status: Home / Self Care | Payer: 59 | Attending: Cardiothoracic Surgery | Admitting: Cardiothoracic Surgery

## 2015-03-03 ENCOUNTER — Inpatient Hospital Stay (HOSPITAL_COMMUNITY): Payer: 59 | Admitting: Anesthesiology

## 2015-03-03 ENCOUNTER — Encounter (HOSPITAL_COMMUNITY)
Admission: EM | Disposition: A | Discharge status: Home / Self Care | Payer: PRIVATE HEALTH INSURANCE | Source: Home / Self Care | Attending: Cardiothoracic Surgery

## 2015-03-03 ENCOUNTER — Encounter (HOSPITAL_COMMUNITY): Payer: Self-pay | Admitting: Anesthesiology

## 2015-03-03 ENCOUNTER — Inpatient Hospital Stay (HOSPITAL_COMMUNITY): Payer: 59

## 2015-03-03 DIAGNOSIS — Z951 Presence of aortocoronary bypass graft: Secondary | ICD-10-CM

## 2015-03-03 HISTORY — PX: TEE WITHOUT CARDIOVERSION: SHX5443

## 2015-03-03 HISTORY — PX: CORONARY ARTERY BYPASS GRAFT: SHX141

## 2015-03-03 LAB — POCT I-STAT 3, ART BLOOD GAS (G3+)
ACID-BASE EXCESS: 2 mmol/L (ref 0.0–2.0)
ACID-BASE EXCESS: 1 mmol/L (ref 0.0–2.0)
ACID-BASE EXCESS: 1 mmol/L (ref 0.0–2.0)
Acid-Base Excess: 2 mmol/L (ref 0.0–2.0)
BICARBONATE: 25.2 meq/L — AB (ref 20.0–24.0)
BICARBONATE: 25.9 meq/L — AB (ref 20.0–24.0)
Bicarbonate: 27.5 mEq/L — ABNORMAL HIGH (ref 20.0–24.0)
Bicarbonate: 25.8 mEq/L — ABNORMAL HIGH (ref 20.0–24.0)
Bicarbonate: 26.7 mEq/L — ABNORMAL HIGH (ref 20.0–24.0)
O2 SAT: 100 %
O2 SAT: 92 %
O2 SAT: 94 %
O2 Saturation: 97 %
O2 Saturation: 97 %
PCO2 ART: 40.7 mmHg (ref 35.0–45.0)
PH ART: 7.402 (ref 7.350–7.450)
PH ART: 7.428 (ref 7.350–7.450)
PH ART: 7.381 (ref 7.350–7.450)
PO2 ART: 65 mmHg — AB (ref 80.0–100.0)
PO2 ART: 73 mmHg — AB (ref 80.0–100.0)
Patient temperature: 37.7
TCO2: 29 mmol/L (ref 0–100)
TCO2: 26 mmol/L (ref 0–100)
TCO2: 27 mmol/L (ref 0–100)
TCO2: 28 mmol/L (ref 0–100)
TCO2: 27 mmol/L (ref 0–100)
pCO2 arterial: 45.6 mmHg — ABNORMAL HIGH (ref 35.0–45.0)
pCO2 arterial: 40.5 mmHg (ref 35.0–45.0)
pCO2 arterial: 41.7 mmHg (ref 35.0–45.0)
pCO2 arterial: 43.9 mmHg (ref 35.0–45.0)
pH, Arterial: 7.389 (ref 7.350–7.450)
pH, Arterial: 7.401 (ref 7.350–7.450)
pO2, Arterial: 286 mmHg — ABNORMAL HIGH (ref 80.0–100.0)
pO2, Arterial: 95 mmHg (ref 80.0–100.0)
pO2, Arterial: 91 mmHg (ref 80.0–100.0)

## 2015-03-03 LAB — POCT I-STAT, CHEM 8
BUN: 16 mg/dL (ref 6–20)
BUN: 15 mg/dL (ref 6–20)
BUN: 15 mg/dL (ref 6–20)
BUN: 15 mg/dL (ref 6–20)
BUN: 16 mg/dL (ref 6–20)
BUN: 19 mg/dL (ref 6–20)
CALCIUM ION: 1.13 mmol/L (ref 1.12–1.23)
CALCIUM ION: 1.12 mmol/L (ref 1.12–1.23)
CALCIUM ION: 1.07 mmol/L — AB (ref 1.12–1.23)
CALCIUM ION: 1.11 mmol/L — AB (ref 1.12–1.23)
CHLORIDE: 96 mmol/L — AB (ref 101–111)
CHLORIDE: 97 mmol/L — AB (ref 101–111)
CREATININE: 0.5 mg/dL — AB (ref 0.61–1.24)
CREATININE: 0.6 mg/dL — AB (ref 0.61–1.24)
CREATININE: 0.5 mg/dL — AB (ref 0.61–1.24)
Calcium, Ion: 1.07 mmol/L — ABNORMAL LOW (ref 1.12–1.23)
Calcium, Ion: 1.08 mmol/L — ABNORMAL LOW (ref 1.12–1.23)
Chloride: 94 mmol/L — ABNORMAL LOW (ref 101–111)
Chloride: 95 mmol/L — ABNORMAL LOW (ref 101–111)
Chloride: 95 mmol/L — ABNORMAL LOW (ref 101–111)
Chloride: 101 mmol/L (ref 101–111)
Creatinine, Ser: 0.5 mg/dL — ABNORMAL LOW (ref 0.61–1.24)
Creatinine, Ser: 0.6 mg/dL — ABNORMAL LOW (ref 0.61–1.24)
Creatinine, Ser: 0.6 mg/dL — ABNORMAL LOW (ref 0.61–1.24)
GLUCOSE: 227 mg/dL — AB (ref 65–99)
GLUCOSE: 267 mg/dL — AB (ref 65–99)
Glucose, Bld: 222 mg/dL — ABNORMAL HIGH (ref 65–99)
Glucose, Bld: 222 mg/dL — ABNORMAL HIGH (ref 65–99)
Glucose, Bld: 236 mg/dL — ABNORMAL HIGH (ref 65–99)
Glucose, Bld: 138 mg/dL — ABNORMAL HIGH (ref 65–99)
HCT: 33 % — ABNORMAL LOW (ref 39.0–52.0)
HCT: 35 % — ABNORMAL LOW (ref 39.0–52.0)
HCT: 31 % — ABNORMAL LOW (ref 39.0–52.0)
HEMATOCRIT: 27 % — AB (ref 39.0–52.0)
HEMATOCRIT: 32 % — AB (ref 39.0–52.0)
HEMATOCRIT: 34 % — AB (ref 39.0–52.0)
HEMOGLOBIN: 9.2 g/dL — AB (ref 13.0–17.0)
HEMOGLOBIN: 10.9 g/dL — AB (ref 13.0–17.0)
HEMOGLOBIN: 11.6 g/dL — AB (ref 13.0–17.0)
Hemoglobin: 11.2 g/dL — ABNORMAL LOW (ref 13.0–17.0)
Hemoglobin: 11.9 g/dL — ABNORMAL LOW (ref 13.0–17.0)
Hemoglobin: 10.5 g/dL — ABNORMAL LOW (ref 13.0–17.0)
POTASSIUM: 3.5 mmol/L (ref 3.5–5.1)
Potassium: 3.5 mmol/L (ref 3.5–5.1)
Potassium: 3.7 mmol/L (ref 3.5–5.1)
Potassium: 3.1 mmol/L — ABNORMAL LOW (ref 3.5–5.1)
Potassium: 3.5 mmol/L (ref 3.5–5.1)
Potassium: 4.5 mmol/L (ref 3.5–5.1)
SODIUM: 133 mmol/L — AB (ref 135–145)
SODIUM: 132 mmol/L — AB (ref 135–145)
SODIUM: 136 mmol/L (ref 135–145)
Sodium: 132 mmol/L — ABNORMAL LOW (ref 135–145)
Sodium: 134 mmol/L — ABNORMAL LOW (ref 135–145)
Sodium: 133 mmol/L — ABNORMAL LOW (ref 135–145)
TCO2: 27 mmol/L (ref 0–100)
TCO2: 27 mmol/L (ref 0–100)
TCO2: 27 mmol/L (ref 0–100)
TCO2: 28 mmol/L (ref 0–100)
TCO2: 22 mmol/L (ref 0–100)
TCO2: 25 mmol/L (ref 0–100)

## 2015-03-03 LAB — GLUCOSE, CAPILLARY
GLUCOSE-CAPILLARY: 190 mg/dL — AB (ref 65–99)
GLUCOSE-CAPILLARY: 115 mg/dL — AB (ref 65–99)
GLUCOSE-CAPILLARY: 135 mg/dL — AB (ref 65–99)
GLUCOSE-CAPILLARY: 118 mg/dL — AB (ref 65–99)
Glucose-Capillary: 120 mg/dL — ABNORMAL HIGH (ref 65–99)
Glucose-Capillary: 137 mg/dL — ABNORMAL HIGH (ref 65–99)

## 2015-03-03 LAB — BASIC METABOLIC PANEL
Anion gap: 9 (ref 5–15)
BUN: 17 mg/dL (ref 6–20)
CO2: 25 mmol/L (ref 22–32)
Calcium: 8.1 mg/dL — ABNORMAL LOW (ref 8.9–10.3)
Chloride: 95 mmol/L — ABNORMAL LOW (ref 101–111)
Creatinine, Ser: 0.85 mg/dL (ref 0.61–1.24)
GFR calc Af Amer: >60 mL/min (ref >60)
GFR calc non Af Amer: >60 mL/min (ref >60)
Glucose, Bld: 230 mg/dL — ABNORMAL HIGH (ref 65–99)
Potassium: 3.2 mmol/L — ABNORMAL LOW (ref 3.5–5.1)
Sodium: 129 mmol/L — ABNORMAL LOW (ref 135–145)

## 2015-03-03 LAB — HEMOGLOBIN AND HEMATOCRIT, BLOOD
HCT: 27.9 % — ABNORMAL LOW (ref 39.0–52.0)
Hemoglobin: 10 g/dL — ABNORMAL LOW (ref 13.0–17.0)

## 2015-03-03 LAB — CBC
HCT: 35.2 % — ABNORMAL LOW (ref 39.0–52.0)
HCT: 33.6 % — ABNORMAL LOW (ref 39.0–52.0)
HEMATOCRIT: 32.8 % — AB (ref 39.0–52.0)
HEMOGLOBIN: 11.7 g/dL — AB (ref 13.0–17.0)
Hemoglobin: 12.1 g/dL — ABNORMAL LOW (ref 13.0–17.0)
Hemoglobin: 11.7 g/dL — ABNORMAL LOW (ref 13.0–17.0)
MCH: 29.8 pg (ref 26.0–34.0)
MCH: 29.2 pg (ref 26.0–34.0)
MCH: 29 pg (ref 26.0–34.0)
MCHC: 34.4 g/dL (ref 30.0–36.0)
MCHC: 35.7 g/dL (ref 30.0–36.0)
MCHC: 34.8 g/dL (ref 30.0–36.0)
MCV: 84.8 fL (ref 78.0–100.0)
MCV: 83.5 fL (ref 78.0–100.0)
MCV: 83.2 fL (ref 78.0–100.0)
PLATELETS: 179 10*3/uL (ref 150–400)
Platelets: 123 10*3/uL — ABNORMAL LOW (ref 150–400)
Platelets: 205 10*3/uL (ref 150–400)
RBC: 3.93 MIL/uL — ABNORMAL LOW (ref 4.22–5.81)
RBC: 4.15 MIL/uL — AB (ref 4.22–5.81)
RBC: 4.04 MIL/uL — ABNORMAL LOW (ref 4.22–5.81)
RDW: 12.6 % (ref 11.5–15.5)
RDW: 13 % (ref 11.5–15.5)
RDW: 12.7 % (ref 11.5–15.5)
WBC: 15.5 10*3/uL — AB (ref 4.0–10.5)
WBC: 13.7 10*3/uL — AB (ref 4.0–10.5)
WBC: 16.8 10*3/uL — ABNORMAL HIGH (ref 4.0–10.5)

## 2015-03-03 LAB — PROTIME-INR
INR: 1.54 — ABNORMAL HIGH (ref 0.00–1.49)
Prothrombin Time: 18.5 seconds — ABNORMAL HIGH (ref 11.6–15.2)

## 2015-03-03 LAB — POCT I-STAT 4, (NA,K, GLUC, HGB,HCT)
GLUCOSE: 184 mg/dL — AB (ref 65–99)
HEMATOCRIT: 33 % — AB (ref 39.0–52.0)
HEMOGLOBIN: 11.2 g/dL — AB (ref 13.0–17.0)
POTASSIUM: 3.4 mmol/L — AB (ref 3.5–5.1)
Sodium: 135 mmol/L (ref 135–145)

## 2015-03-03 LAB — PREPARE RBC (CROSSMATCH)

## 2015-03-03 LAB — CREATININE, SERUM
Creatinine, Ser: 0.62 mg/dL (ref 0.61–1.24)
GFR calc Af Amer: >60 mL/min (ref >60)
GFR calc non Af Amer: >60 mL/min (ref >60)

## 2015-03-03 LAB — MAGNESIUM: Magnesium: 2.7 mg/dL — ABNORMAL HIGH (ref 1.7–2.4)

## 2015-03-03 LAB — PLATELET COUNT: Platelets: 165 10*3/uL (ref 150–400)

## 2015-03-03 LAB — APTT: APTT: 32 s (ref 24–37)

## 2015-03-03 LAB — HEPARIN LEVEL (UNFRACTIONATED): Heparin Unfractionated: 0.36 IU/mL (ref 0.30–0.70)

## 2015-03-03 SURGERY — CORONARY ARTERY BYPASS GRAFTING (CABG)
Anesthesia: General | Site: Chest

## 2015-03-03 MED ORDER — ONDANSETRON HCL 4 MG/2ML IJ SOLN
4.0000 mg | Freq: Once | INTRAMUSCULAR | Status: AC | PRN
  Administered 2015-03-03: 4 mg via INTRAVENOUS
  Filled 2015-03-03: qty 2

## 2015-03-03 MED ORDER — LACTATED RINGERS IV SOLN
INTRAVENOUS | Status: DC
  Administered 2015-03-03: 15:00:00 via INTRAVENOUS

## 2015-03-03 MED ORDER — MIDAZOLAM HCL 10 MG/2ML IJ SOLN
INTRAMUSCULAR | Status: AC
  Filled 2015-03-03: qty 4

## 2015-03-03 MED ORDER — MILRINONE IN DEXTROSE 20 MG/100ML IV SOLN
0.3000 ug/kg/min | INTRAVENOUS | Status: DC
  Administered 2015-03-03 – 2015-03-04 (×3): 0.3 ug/kg/min via INTRAVENOUS
  Filled 2015-03-03 (×2): qty 100

## 2015-03-03 MED ORDER — METOPROLOL TARTRATE 25 MG/10 ML ORAL SUSPENSION
12.5000 mg | Freq: Two times a day (BID) | ORAL | Status: DC
  Filled 2015-03-03 (×11): qty 5

## 2015-03-03 MED ORDER — FAMOTIDINE IN NACL 20-0.9 MG/50ML-% IV SOLN
20.0000 mg | Freq: Two times a day (BID) | INTRAVENOUS | Status: AC
  Administered 2015-03-03 (×2): 20 mg via INTRAVENOUS
  Filled 2015-03-03: qty 50

## 2015-03-03 MED ORDER — METOPROLOL TARTRATE 1 MG/ML IV SOLN: 2.5000 mg | INTRAVENOUS | Status: DC | PRN

## 2015-03-03 MED ORDER — SODIUM CHLORIDE 0.9 % IJ SOLN
OROMUCOSAL | Status: DC | PRN
  Administered 2015-03-03 (×4): 4 mL via TOPICAL

## 2015-03-03 MED ORDER — SODIUM CHLORIDE 0.9 % IV SOLN: Freq: Once | INTRAVENOUS | Status: DC

## 2015-03-03 MED ORDER — HEPARIN SODIUM (PORCINE) 1000 UNIT/ML IJ SOLN
INTRAMUSCULAR | Status: AC
Start: 2015-03-03 — End: 2015-03-03
  Filled 2015-03-03: qty 1

## 2015-03-03 MED ORDER — VECURONIUM BROMIDE 10 MG IV SOLR
INTRAVENOUS | Status: AC
  Filled 2015-03-03: qty 10

## 2015-03-03 MED ORDER — LACTATED RINGERS IV SOLN
INTRAVENOUS | Status: DC | PRN
  Administered 2015-03-03: 07:00:00 via INTRAVENOUS

## 2015-03-03 MED ORDER — DEXTROSE 5 % IV SOLN
1.5000 g | Freq: Two times a day (BID) | INTRAVENOUS | Status: AC
  Administered 2015-03-03 – 2015-03-05 (×4): 1.5 g via INTRAVENOUS
  Filled 2015-03-03 (×4): qty 1.5

## 2015-03-03 MED ORDER — ONDANSETRON HCL 4 MG/2ML IJ SOLN
4.0000 mg | Freq: Four times a day (QID) | INTRAMUSCULAR | Status: DC | PRN
  Administered 2015-03-03 – 2015-03-04 (×2): 4 mg via INTRAVENOUS
  Filled 2015-03-03 (×2): qty 2

## 2015-03-03 MED ORDER — PROPOFOL 10 MG/ML IV BOLUS
INTRAVENOUS | Status: AC
  Filled 2015-03-03: qty 20

## 2015-03-03 MED ORDER — SODIUM CHLORIDE 0.45 % IV SOLN
INTRAVENOUS | Status: DC | PRN
  Administered 2015-03-03: 14:00:00 via INTRAVENOUS

## 2015-03-03 MED ORDER — POTASSIUM CHLORIDE ER 10 MEQ PO TBCR
20.0000 meq | EXTENDED_RELEASE_TABLET | Freq: Once | ORAL | Status: AC
  Administered 2015-03-03: 20 meq via ORAL
  Filled 2015-03-03 (×2): qty 2

## 2015-03-03 MED ORDER — KETOROLAC TROMETHAMINE 30 MG/ML IJ SOLN
30.0000 mg | Freq: Four times a day (QID) | INTRAMUSCULAR | Status: DC | PRN
  Administered 2015-03-03: 30 mg via INTRAVENOUS
  Filled 2015-03-03: qty 1

## 2015-03-03 MED ORDER — SODIUM CHLORIDE 0.9 % IV SOLN
INTRAVENOUS | Status: DC
  Administered 2015-03-03: 7.4 [IU]/h via INTRAVENOUS
  Administered 2015-03-03: 4.6 [IU]/h via INTRAVENOUS
  Filled 2015-03-03 (×2): qty 2.5

## 2015-03-03 MED ORDER — INSULIN REGULAR BOLUS VIA INFUSION
0.0000 [IU] | Freq: Three times a day (TID) | INTRAVENOUS | Status: DC
  Administered 2015-03-04: 2 [IU] via INTRAVENOUS
  Filled 2015-03-03: qty 10

## 2015-03-03 MED ORDER — SODIUM CHLORIDE 0.9 % IJ SOLN
INTRAMUSCULAR | Status: AC
Start: 2015-03-03 — End: 2015-03-03
  Filled 2015-03-03: qty 10

## 2015-03-03 MED ORDER — SODIUM CHLORIDE 0.9 % IJ SOLN
3.0000 mL | INTRAMUSCULAR | Status: DC | PRN
  Administered 2015-03-04 – 2015-03-05 (×2): 3 mL via INTRAVENOUS
  Filled 2015-03-03 (×2): qty 3

## 2015-03-03 MED ORDER — VANCOMYCIN HCL IN DEXTROSE 1-5 GM/200ML-% IV SOLN
1000.0000 mg | Freq: Once | INTRAVENOUS | Status: AC
  Administered 2015-03-03: 1000 mg via INTRAVENOUS
  Filled 2015-03-03: qty 200

## 2015-03-03 MED ORDER — ONDANSETRON HCL 4 MG/2ML IJ SOLN
INTRAMUSCULAR | Status: AC
  Filled 2015-03-03: qty 2

## 2015-03-03 MED ORDER — FENTANYL CITRATE (PF) 250 MCG/5ML IJ SOLN
INTRAMUSCULAR | Status: AC
  Filled 2015-03-03: qty 5

## 2015-03-03 MED ORDER — METOPROLOL TARTRATE 12.5 MG HALF TABLET
12.5000 mg | ORAL_TABLET | Freq: Two times a day (BID) | ORAL | Status: DC
  Administered 2015-03-04 – 2015-03-08 (×8): 12.5 mg via ORAL
  Filled 2015-03-03 (×14): qty 1

## 2015-03-03 MED ORDER — SUCCINYLCHOLINE CHLORIDE 20 MG/ML IJ SOLN
INTRAMUSCULAR | Status: AC
Start: 2015-03-03 — End: 2015-03-03
  Filled 2015-03-03: qty 1

## 2015-03-03 MED ORDER — ASPIRIN 81 MG PO CHEW: 324.0000 mg | CHEWABLE_TABLET | Freq: Every day | ORAL | Status: DC

## 2015-03-03 MED ORDER — NITROGLYCERIN IN D5W 200-5 MCG/ML-% IV SOLN: 0.0000 ug/min | INTRAVENOUS | Status: DC

## 2015-03-03 MED ORDER — POTASSIUM CHLORIDE 10 MEQ/50ML IV SOLN
10.0000 meq | INTRAVENOUS | Status: AC
  Administered 2015-03-03: 10 meq via INTRAVENOUS

## 2015-03-03 MED ORDER — VECURONIUM BROMIDE 10 MG IV SOLR
INTRAVENOUS | Status: DC | PRN
  Administered 2015-03-03 (×4): 5 mg via INTRAVENOUS

## 2015-03-03 MED ORDER — ALBUMIN HUMAN 5 % IV SOLN
INTRAVENOUS | Status: DC | PRN
  Administered 2015-03-03: 13:00:00 via INTRAVENOUS

## 2015-03-03 MED ORDER — MIDAZOLAM HCL 2 MG/2ML IJ SOLN
2.0000 mg | INTRAMUSCULAR | Status: DC | PRN
  Filled 2015-03-03: qty 2

## 2015-03-03 MED ORDER — MILRINONE IN DEXTROSE 20 MG/100ML IV SOLN
INTRAVENOUS | Status: DC | PRN
  Administered 2015-03-03: .3 ug/kg/min via INTRAVENOUS

## 2015-03-03 MED ORDER — DOCUSATE SODIUM 100 MG PO CAPS
200.0000 mg | ORAL_CAPSULE | Freq: Every day | ORAL | Status: DC
  Administered 2015-03-04 – 2015-03-08 (×5): 200 mg via ORAL
  Filled 2015-03-03 (×6): qty 2

## 2015-03-03 MED ORDER — HEPARIN SODIUM (PORCINE) 1000 UNIT/ML IJ SOLN
INTRAMUSCULAR | Status: AC
  Filled 2015-03-03: qty 1

## 2015-03-03 MED ORDER — ANTISEPTIC ORAL RINSE SOLUTION (CORINZ)
7.0000 mL | Freq: Four times a day (QID) | OROMUCOSAL | Status: DC
  Administered 2015-03-03 – 2015-03-06 (×4): 7 mL via OROMUCOSAL

## 2015-03-03 MED ORDER — AMIODARONE HCL IN DEXTROSE 360-4.14 MG/200ML-% IV SOLN
60.0000 mg/h | INTRAVENOUS | Status: AC
  Administered 2015-03-03: 60 mg/h via INTRAVENOUS
  Filled 2015-03-03: qty 200

## 2015-03-03 MED ORDER — 0.9 % SODIUM CHLORIDE (POUR BTL) OPTIME
TOPICAL | Status: DC | PRN
  Administered 2015-03-03: 6000 mL

## 2015-03-03 MED ORDER — KETOROLAC TROMETHAMINE 15 MG/ML IJ SOLN
15.0000 mg | Freq: Four times a day (QID) | INTRAMUSCULAR | Status: DC
  Administered 2015-03-04 – 2015-03-05 (×6): 15 mg via INTRAVENOUS
  Filled 2015-03-03 (×10): qty 1

## 2015-03-03 MED ORDER — SODIUM CHLORIDE 0.9 % IV SOLN
250.0000 mL | INTRAVENOUS | Status: DC
  Administered 2015-03-03: 13:00:00 via INTRAVENOUS

## 2015-03-03 MED ORDER — DEXMEDETOMIDINE HCL IN NACL 200 MCG/50ML IV SOLN
0.0000 ug/kg/h | INTRAVENOUS | Status: DC
  Administered 2015-03-03: 0.7 ug/kg/h via INTRAVENOUS
  Filled 2015-03-03: qty 50

## 2015-03-03 MED ORDER — CHLORHEXIDINE GLUCONATE CLOTH 2 % EX PADS
6.0000 | MEDICATED_PAD | Freq: Every day | CUTANEOUS | Status: DC
  Administered 2015-03-04 – 2015-03-06 (×3): 6 via TOPICAL

## 2015-03-03 MED ORDER — MORPHINE SULFATE (PF) 2 MG/ML IV SOLN: 1.0000 mg | INTRAVENOUS | Status: AC | PRN

## 2015-03-03 MED ORDER — LACTATED RINGERS IV SOLN: 500.0000 mL | Freq: Once | INTRAVENOUS | Status: DC | PRN

## 2015-03-03 MED ORDER — LACTATED RINGERS IV SOLN
INTRAVENOUS | Status: DC
  Administered 2015-03-03 (×2): via INTRAVENOUS

## 2015-03-03 MED ORDER — PHENYLEPHRINE HCL 10 MG/ML IJ SOLN
0.0000 ug/min | INTRAVENOUS | Status: DC
  Administered 2015-03-03: 10 ug/min via INTRAVENOUS
  Administered 2015-03-03: 45 ug/min via INTRAVENOUS
  Filled 2015-03-03 (×3): qty 2

## 2015-03-03 MED ORDER — HEPARIN SODIUM (PORCINE) 1000 UNIT/ML IJ SOLN
INTRAMUSCULAR | Status: DC | PRN
Start: 2015-03-03 — End: 2015-03-03
  Administered 2015-03-03: 36000 [IU] via INTRAVENOUS
  Administered 2015-03-03: 5000 [IU] via INTRAVENOUS

## 2015-03-03 MED ORDER — BISACODYL 10 MG RE SUPP: 10.0000 mg | Freq: Every day | RECTAL | Status: DC

## 2015-03-03 MED ORDER — ACETAMINOPHEN 160 MG/5ML PO SOLN
1000.0000 mg | Freq: Four times a day (QID) | ORAL | Status: DC
  Filled 2015-03-03: qty 40

## 2015-03-03 MED ORDER — METOCLOPRAMIDE HCL 5 MG/ML IJ SOLN
10.0000 mg | Freq: Four times a day (QID) | INTRAMUSCULAR | Status: DC
  Administered 2015-03-03 – 2015-03-04 (×3): 10 mg via INTRAVENOUS
  Filled 2015-03-03 (×2): qty 2

## 2015-03-03 MED ORDER — LIDOCAINE HCL (CARDIAC) 20 MG/ML IV SOLN
INTRAVENOUS | Status: DC | PRN
  Administered 2015-03-03: 30 mg via INTRAVENOUS

## 2015-03-03 MED ORDER — DEXTROSE 5 % IV SOLN
10.0000 mg | INTRAVENOUS | Status: DC | PRN
  Administered 2015-03-03: 15 ug/min via INTRAVENOUS

## 2015-03-03 MED ORDER — ROCURONIUM BROMIDE 50 MG/5ML IV SOLN
INTRAVENOUS | Status: AC
  Filled 2015-03-03: qty 1

## 2015-03-03 MED ORDER — FENTANYL CITRATE (PF) 250 MCG/5ML IJ SOLN
INTRAMUSCULAR | Status: AC
Start: 2015-03-03 — End: 2015-03-03
  Filled 2015-03-03: qty 5

## 2015-03-03 MED ORDER — ACETAMINOPHEN 500 MG PO TABS
1000.0000 mg | ORAL_TABLET | Freq: Four times a day (QID) | ORAL | Status: DC
  Administered 2015-03-04 – 2015-03-08 (×14): 1000 mg via ORAL
  Filled 2015-03-03 (×21): qty 2

## 2015-03-03 MED ORDER — LIDOCAINE HCL (CARDIAC) 20 MG/ML IV SOLN
INTRAVENOUS | Status: AC
  Filled 2015-03-03: qty 5

## 2015-03-03 MED ORDER — LACTATED RINGERS IV SOLN
INTRAVENOUS | Status: DC | PRN
  Administered 2015-03-03 (×2): via INTRAVENOUS

## 2015-03-03 MED ORDER — SODIUM CHLORIDE 0.9 % IJ SOLN
INTRAMUSCULAR | Status: AC
  Filled 2015-03-03: qty 10

## 2015-03-03 MED ORDER — PROTAMINE SULFATE 10 MG/ML IV SOLN
INTRAVENOUS | Status: DC | PRN
  Administered 2015-03-03: 350 mg via INTRAVENOUS

## 2015-03-03 MED ORDER — ACETAMINOPHEN 650 MG RE SUPP
650.0000 mg | Freq: Once | RECTAL | Status: AC
  Administered 2015-03-03: 650 mg via RECTAL

## 2015-03-03 MED ORDER — HYDROMORPHONE HCL 1 MG/ML IJ SOLN
0.2500 mg | INTRAMUSCULAR | Status: DC | PRN
  Administered 2015-03-03 – 2015-03-04 (×3): 0.5 mg via INTRAVENOUS
  Filled 2015-03-03 (×3): qty 1

## 2015-03-03 MED ORDER — VECURONIUM BROMIDE 10 MG IV SOLR
INTRAVENOUS | Status: AC
Start: 2015-03-03 — End: 2015-03-03
  Filled 2015-03-03: qty 10

## 2015-03-03 MED ORDER — CHLORHEXIDINE GLUCONATE 0.12% ORAL RINSE (MEDLINE KIT)
15.0000 mL | Freq: Two times a day (BID) | OROMUCOSAL | Status: DC
  Administered 2015-03-03 – 2015-03-04 (×3): 15 mL via OROMUCOSAL

## 2015-03-03 MED ORDER — MILRINONE IN DEXTROSE 20 MG/100ML IV SOLN
0.3750 ug/kg/min | INTRAVENOUS | Status: DC
  Filled 2015-03-03: qty 100

## 2015-03-03 MED ORDER — SODIUM CHLORIDE 0.9 % IJ SOLN
3.0000 mL | Freq: Two times a day (BID) | INTRAMUSCULAR | Status: DC
  Administered 2015-03-04 – 2015-03-08 (×6): 3 mL via INTRAVENOUS

## 2015-03-03 MED ORDER — INSULIN REGULAR HUMAN 100 UNIT/ML IJ SOLN
250.0000 [IU] | INTRAMUSCULAR | Status: DC | PRN
Start: 2015-03-03 — End: 2015-03-03
  Administered 2015-03-03: 1 [IU]/h via INTRAVENOUS

## 2015-03-03 MED ORDER — ASPIRIN EC 325 MG PO TBEC
325.0000 mg | DELAYED_RELEASE_TABLET | Freq: Every day | ORAL | Status: DC
  Administered 2015-03-04 – 2015-03-08 (×5): 325 mg via ORAL
  Filled 2015-03-03 (×6): qty 1

## 2015-03-03 MED ORDER — ALBUMIN HUMAN 5 % IV SOLN
250.0000 mL | INTRAVENOUS | Status: AC | PRN
  Administered 2015-03-03 (×2): 250 mL via INTRAVENOUS
  Filled 2015-03-03: qty 250

## 2015-03-03 MED ORDER — OXYCODONE HCL 5 MG PO TABS
5.0000 mg | ORAL_TABLET | ORAL | Status: DC | PRN
  Administered 2015-03-05 (×2): 5 mg via ORAL
  Administered 2015-03-05: 10 mg via ORAL
  Administered 2015-03-06 – 2015-03-07 (×4): 5 mg via ORAL
  Administered 2015-03-08: 10 mg via ORAL
  Filled 2015-03-03: qty 2
  Filled 2015-03-03 (×4): qty 1
  Filled 2015-03-03: qty 2
  Filled 2015-03-03 (×2): qty 1

## 2015-03-03 MED ORDER — DOPAMINE-DEXTROSE 3.2-5 MG/ML-% IV SOLN
3.0000 ug/kg/min | INTRAVENOUS | Status: DC
  Administered 2015-03-03: 3 ug/kg/min via INTRAVENOUS

## 2015-03-03 MED ORDER — EPHEDRINE SULFATE 50 MG/ML IJ SOLN
INTRAMUSCULAR | Status: AC
  Filled 2015-03-03: qty 1

## 2015-03-03 MED ORDER — PANTOPRAZOLE SODIUM 40 MG PO TBEC
40.0000 mg | DELAYED_RELEASE_TABLET | Freq: Every day | ORAL | Status: DC
  Administered 2015-03-05 – 2015-03-08 (×4): 40 mg via ORAL
  Filled 2015-03-03 (×4): qty 1

## 2015-03-03 MED ORDER — MIDAZOLAM HCL 5 MG/5ML IJ SOLN
INTRAMUSCULAR | Status: DC | PRN
  Administered 2015-03-03: 2 mg via INTRAVENOUS
  Administered 2015-03-03: 3 mg via INTRAVENOUS
  Administered 2015-03-03: 1 mg via INTRAVENOUS
  Administered 2015-03-03 (×2): 2 mg via INTRAVENOUS

## 2015-03-03 MED ORDER — FENTANYL CITRATE (PF) 100 MCG/2ML IJ SOLN
INTRAMUSCULAR | Status: DC | PRN
  Administered 2015-03-03: 50 ug via INTRAVENOUS
  Administered 2015-03-03 (×2): 250 ug via INTRAVENOUS
  Administered 2015-03-03: 600 ug via INTRAVENOUS
  Administered 2015-03-03: 50 ug via INTRAVENOUS
  Administered 2015-03-03 (×2): 250 ug via INTRAVENOUS
  Administered 2015-03-03: 50 ug via INTRAVENOUS

## 2015-03-03 MED ORDER — KETOROLAC TROMETHAMINE 15 MG/ML IJ SOLN: 15.0000 mg | Freq: Four times a day (QID) | INTRAMUSCULAR | Status: DC

## 2015-03-03 MED ORDER — ROCURONIUM BROMIDE 100 MG/10ML IV SOLN
INTRAVENOUS | Status: DC | PRN
  Administered 2015-03-03: 50 mg via INTRAVENOUS

## 2015-03-03 MED ORDER — SODIUM CHLORIDE 0.9 % IV SOLN
10.0000 g | INTRAVENOUS | Status: DC | PRN
  Administered 2015-03-03: 5 g via INTRAVENOUS
  Administered 2015-03-03: 1 g/h via INTRAVENOUS

## 2015-03-03 MED ORDER — TRAMADOL HCL 50 MG PO TABS
50.0000 mg | ORAL_TABLET | ORAL | Status: DC | PRN
  Administered 2015-03-06: 100 mg via ORAL
  Filled 2015-03-03: qty 2

## 2015-03-03 MED ORDER — MAGNESIUM SULFATE 4 GM/100ML IV SOLN
4.0000 g | Freq: Once | INTRAVENOUS | Status: AC
  Administered 2015-03-03: 4 g via INTRAVENOUS
  Filled 2015-03-03: qty 100

## 2015-03-03 MED ORDER — AMIODARONE HCL IN DEXTROSE 360-4.14 MG/200ML-% IV SOLN
30.0000 mg/h | INTRAVENOUS | Status: DC
  Filled 2015-03-03: qty 200

## 2015-03-03 MED ORDER — MUPIROCIN 2 % EX OINT
1.0000 "application " | TOPICAL_OINTMENT | Freq: Two times a day (BID) | CUTANEOUS | Status: DC
  Administered 2015-03-03 – 2015-03-06 (×7): 1 via NASAL
  Filled 2015-03-03 (×3): qty 22

## 2015-03-03 MED ORDER — POTASSIUM CHLORIDE 10 MEQ/50ML IV SOLN
10.0000 meq | INTRAVENOUS | Status: AC
  Administered 2015-03-03 (×3): 10 meq via INTRAVENOUS

## 2015-03-03 MED ORDER — MEPERIDINE HCL 25 MG/ML IJ SOLN: 6.2500 mg | INTRAMUSCULAR | Status: DC | PRN

## 2015-03-03 MED ORDER — PROPOFOL 10 MG/ML IV BOLUS
INTRAVENOUS | Status: DC | PRN
  Administered 2015-03-03: 80 mg via INTRAVENOUS
  Administered 2015-03-03: 40 mg via INTRAVENOUS

## 2015-03-03 MED ORDER — VANCOMYCIN HCL 1000 MG IV SOLR
1000.0000 mg | INTRAVENOUS | Status: DC | PRN
  Administered 2015-03-03: 1500 mg via INTRAVENOUS

## 2015-03-03 MED ORDER — ACETAMINOPHEN 160 MG/5ML PO SOLN: 650.0000 mg | Freq: Once | ORAL | Status: AC

## 2015-03-03 MED ORDER — HEMOSTATIC AGENTS (NO CHARGE) OPTIME
TOPICAL | Status: DC | PRN
  Administered 2015-03-03 (×3): 1 via TOPICAL

## 2015-03-03 MED ORDER — BISACODYL 5 MG PO TBEC
10.0000 mg | DELAYED_RELEASE_TABLET | Freq: Every day | ORAL | Status: DC
  Administered 2015-03-04 – 2015-03-08 (×4): 10 mg via ORAL
  Filled 2015-03-03 (×4): qty 2

## 2015-03-03 MED ORDER — SODIUM CHLORIDE 0.9 % IV SOLN
INTRAVENOUS | Status: DC
  Administered 2015-03-03: 10 mL via INTRAVENOUS

## 2015-03-03 MED ORDER — MORPHINE SULFATE (PF) 2 MG/ML IV SOLN
2.0000 mg | INTRAVENOUS | Status: DC | PRN
  Administered 2015-03-03: 2 mg via INTRAVENOUS
  Filled 2015-03-03: qty 1

## 2015-03-03 MED ORDER — PHENYLEPHRINE 40 MCG/ML (10ML) SYRINGE FOR IV PUSH (FOR BLOOD PRESSURE SUPPORT)
PREFILLED_SYRINGE | INTRAVENOUS | Status: AC
  Filled 2015-03-03: qty 10

## 2015-03-03 MED FILL — Sodium Bicarbonate IV Soln 8.4%: INTRAVENOUS | Qty: 50 | Status: AC

## 2015-03-03 MED FILL — Heparin Sodium (Porcine) Inj 1000 Unit/ML: INTRAMUSCULAR | Qty: 30 | Status: AC

## 2015-03-03 MED FILL — Heparin Sodium (Porcine) Inj 1000 Unit/ML: INTRAMUSCULAR | Qty: 20 | Status: AC

## 2015-03-03 MED FILL — Magnesium Sulfate Inj 50%: INTRAMUSCULAR | Qty: 10 | Status: AC

## 2015-03-03 MED FILL — Lidocaine HCl IV Inj 20 MG/ML: INTRAVENOUS | Qty: 5 | Status: AC

## 2015-03-03 MED FILL — Electrolyte-R (PH 7.4) Solution: INTRAVENOUS | Qty: 3000 | Status: AC

## 2015-03-03 MED FILL — Potassium Chloride Inj 2 mEq/ML: INTRAVENOUS | Qty: 40 | Status: AC

## 2015-03-03 MED FILL — Mannitol IV Soln 20%: INTRAVENOUS | Qty: 500 | Status: AC

## 2015-03-03 MED FILL — Sodium Chloride IV Soln 0.9%: INTRAVENOUS | Qty: 3000 | Status: AC

## 2015-03-03 SURGICAL SUPPLY — 104 items
ADAPTER CARDIO PERF ANTE/RETRO (ADAPTER) ×4 IMPLANT
BAG DECANTER FOR FLEXI CONT (MISCELLANEOUS) ×4 IMPLANT
BANDAGE ELASTIC 4 VELCRO ST LF (GAUZE/BANDAGES/DRESSINGS) ×4 IMPLANT
BANDAGE ELASTIC 6 VELCRO ST LF (GAUZE/BANDAGES/DRESSINGS) ×4 IMPLANT
BASKET HEART  (ORDER IN 25'S) (MISCELLANEOUS) ×1
BASKET HEART (ORDER IN 25'S) (MISCELLANEOUS) ×1
BASKET HEART (ORDER IN 25S) (MISCELLANEOUS) ×2 IMPLANT
BLADE MINI RND TIP GREEN BEAV (BLADE) ×4 IMPLANT
BLADE STERNUM SYSTEM 6 (BLADE) ×4 IMPLANT
BLADE SURG 12 STRL SS (BLADE) ×4 IMPLANT
BLADE SURG ROTATE 9660 (MISCELLANEOUS) ×4 IMPLANT
BNDG GAUZE ELAST 4 BULKY (GAUZE/BANDAGES/DRESSINGS) ×4 IMPLANT
CANISTER SUCTION 2500CC (MISCELLANEOUS) ×4 IMPLANT
CANNULA GUNDRY RCSP 15FR (MISCELLANEOUS) ×4 IMPLANT
CATH CPB KIT VANTRIGT (MISCELLANEOUS) ×4 IMPLANT
CATH ROBINSON RED A/P 18FR (CATHETERS) ×12 IMPLANT
CATH THORACIC 36FR RT ANG (CATHETERS) ×4 IMPLANT
CLIP TI WIDE RED SMALL 24 (CLIP) ×8 IMPLANT
COVER SURGICAL LIGHT HANDLE (MISCELLANEOUS) IMPLANT
CRADLE DONUT ADULT HEAD (MISCELLANEOUS) ×4 IMPLANT
DRAIN CHANNEL 32F RND 10.7 FF (WOUND CARE) ×4 IMPLANT
DRAPE CARDIOVASCULAR INCISE (DRAPES) ×2
DRAPE SLUSH/WARMER DISC (DRAPES) ×4 IMPLANT
DRAPE SRG 135X102X78XABS (DRAPES) ×2 IMPLANT
DRSG AQUACEL AG ADV 3.5X14 (GAUZE/BANDAGES/DRESSINGS) ×4 IMPLANT
ELECT BLADE 4.0 EZ CLEAN MEGAD (MISCELLANEOUS) ×4
ELECT BLADE 6.5 EXT (BLADE) ×4 IMPLANT
ELECT CAUTERY BLADE 6.4 (BLADE) ×4 IMPLANT
ELECT REM PT RETURN 9FT ADLT (ELECTROSURGICAL) ×8
ELECTRODE BLDE 4.0 EZ CLN MEGD (MISCELLANEOUS) ×2 IMPLANT
ELECTRODE REM PT RTRN 9FT ADLT (ELECTROSURGICAL) ×4 IMPLANT
GAUZE SPONGE 4X4 12PLY STRL (GAUZE/BANDAGES/DRESSINGS) ×8 IMPLANT
GLOVE BIO SURGEON STRL SZ 6.5 (GLOVE) ×12 IMPLANT
GLOVE BIO SURGEON STRL SZ7.5 (GLOVE) ×12 IMPLANT
GLOVE BIO SURGEONS STRL SZ 6.5 (GLOVE) ×4
GLOVE BIOGEL PI IND STRL 6 (GLOVE) ×6 IMPLANT
GLOVE BIOGEL PI IND STRL 6.5 (GLOVE) ×4 IMPLANT
GLOVE BIOGEL PI INDICATOR 6 (GLOVE) ×6
GLOVE BIOGEL PI INDICATOR 6.5 (GLOVE) ×4
GOWN STRL REUS W/ TWL LRG LVL3 (GOWN DISPOSABLE) ×16 IMPLANT
GOWN STRL REUS W/TWL LRG LVL3 (GOWN DISPOSABLE) ×16
HEMOSTAT POWDER SURGIFOAM 1G (HEMOSTASIS) ×16 IMPLANT
HEMOSTAT SURGICEL 2X14 (HEMOSTASIS) ×8 IMPLANT
INSERT FOGARTY XLG (MISCELLANEOUS) IMPLANT
KIT BASIN OR (CUSTOM PROCEDURE TRAY) ×4 IMPLANT
KIT ROOM TURNOVER OR (KITS) ×4 IMPLANT
KIT SUCTION CATH 14FR (SUCTIONS) ×4 IMPLANT
KIT VASOVIEW W/TROCAR VH 2000 (KITS) ×4 IMPLANT
LEAD PACING MYOCARDI (MISCELLANEOUS) ×4 IMPLANT
MARKER GRAFT CORONARY BYPASS (MISCELLANEOUS) ×12 IMPLANT
NS IRRIG 1000ML POUR BTL (IV SOLUTION) ×24 IMPLANT
PACK OPEN HEART (CUSTOM PROCEDURE TRAY) ×4 IMPLANT
PAD ARMBOARD 7.5X6 YLW CONV (MISCELLANEOUS) ×16 IMPLANT
PAD ELECT DEFIB RADIOL ZOLL (MISCELLANEOUS) ×4 IMPLANT
PENCIL BUTTON HOLSTER BLD 10FT (ELECTRODE) ×4 IMPLANT
PUNCH AORTIC ROTATE  4.5MM 8IN (MISCELLANEOUS) ×4 IMPLANT
PUNCH AORTIC ROTATE 4.0MM (MISCELLANEOUS) IMPLANT
PUNCH AORTIC ROTATE 4.5MM 8IN (MISCELLANEOUS) IMPLANT
PUNCH AORTIC ROTATE 5MM 8IN (MISCELLANEOUS) IMPLANT
SET CARDIOPLEGIA MPS 5001102 (MISCELLANEOUS) ×4 IMPLANT
SPONGE LAP 18X18 X RAY DECT (DISPOSABLE) ×4 IMPLANT
SPONGE LAP 4X18 X RAY DECT (DISPOSABLE) ×4 IMPLANT
SURGIFLO W/THROMBIN 8M KIT (HEMOSTASIS) ×8 IMPLANT
SUT BONE WAX W31G (SUTURE) ×4 IMPLANT
SUT ETHIBOND 2 0 SH (SUTURE) ×8
SUT ETHIBOND 2 0 SH 36X2 (SUTURE) ×8 IMPLANT
SUT MNCRL AB 4-0 PS2 18 (SUTURE) ×4 IMPLANT
SUT PROLENE 3 0 SH DA (SUTURE) IMPLANT
SUT PROLENE 3 0 SH1 36 (SUTURE) IMPLANT
SUT PROLENE 4 0 RB 1 (SUTURE) ×4
SUT PROLENE 4 0 SH DA (SUTURE) ×4 IMPLANT
SUT PROLENE 4-0 RB1 .5 CRCL 36 (SUTURE) ×4 IMPLANT
SUT PROLENE 5 0 C 1 36 (SUTURE) ×8 IMPLANT
SUT PROLENE 6 0 C 1 30 (SUTURE) ×20 IMPLANT
SUT PROLENE 6 0 CC (SUTURE) ×20 IMPLANT
SUT PROLENE 8 0 BV175 6 (SUTURE) ×8 IMPLANT
SUT PROLENE BLUE 7 0 (SUTURE) ×8 IMPLANT
SUT SILK  1 MH (SUTURE) ×6
SUT SILK 1 MH (SUTURE) ×6 IMPLANT
SUT SILK 1 TIES 10X30 (SUTURE) ×4 IMPLANT
SUT SILK 2 0 SH CR/8 (SUTURE) ×8 IMPLANT
SUT SILK 2 0 TIES 10X30 (SUTURE) ×4 IMPLANT
SUT SILK 2 0 TIES 17X18 (SUTURE) ×2
SUT SILK 2-0 18XBRD TIE BLK (SUTURE) ×2 IMPLANT
SUT SILK 3 0 SH CR/8 (SUTURE) ×8 IMPLANT
SUT SILK 4 0 TIE 10X30 (SUTURE) ×8 IMPLANT
SUT STEEL 6MS V (SUTURE) ×8 IMPLANT
SUT STEEL SZ 6 DBL 3X14 BALL (SUTURE) ×4 IMPLANT
SUT TEM PAC WIRE 2 0 SH (SUTURE) ×16 IMPLANT
SUT VIC AB 1 CTX 36 (SUTURE) ×4
SUT VIC AB 1 CTX36XBRD ANBCTR (SUTURE) ×4 IMPLANT
SUT VIC AB 2-0 CT1 27 (SUTURE) ×2
SUT VIC AB 2-0 CT1 TAPERPNT 27 (SUTURE) ×2 IMPLANT
SUT VIC AB 2-0 CTX 27 (SUTURE) ×8 IMPLANT
SUT VIC AB 3-0 X1 27 (SUTURE) ×8 IMPLANT
SUTURE E-PAK OPEN HEART (SUTURE) IMPLANT
SYSTEM SAHARA CHEST DRAIN ATS (WOUND CARE) ×4 IMPLANT
TAPE CLOTH SURG 4X10 WHT LF (GAUZE/BANDAGES/DRESSINGS) ×4 IMPLANT
TOWEL OR 17X24 6PK STRL BLUE (TOWEL DISPOSABLE) ×8 IMPLANT
TOWEL OR 17X26 10 PK STRL BLUE (TOWEL DISPOSABLE) ×8 IMPLANT
TRAY FOLEY IC TEMP SENS 16FR (CATHETERS) ×4 IMPLANT
TUBING INSUFFLATION (TUBING) ×4 IMPLANT
UNDERPAD 30X30 INCONTINENT (UNDERPADS AND DIAPERS) ×4 IMPLANT
WATER STERILE IRR 1000ML POUR (IV SOLUTION) ×8 IMPLANT

## 2015-03-03 NOTE — Progress Notes (Signed)
[facility] BRIEF SICU PROGRESS NOTE  Day of Surgery  S/P Procedure(s) (LRB): CORONARY ARTERY BYPASS GRAFTING (CABG), ON PUMP, TIMES THREE, USING LEFT INTERNAL MAMMARY ARTERY, RIGHT GREATER SAPHENOUS VEIN HARVESTED ENDOSCOPICALLY (N/A) TRANSESOPHAGEAL ECHOCARDIOGRAM (TEE) (N/A)   Doing well Extubated uneventfully AV paced w/ stable hemodynamics Chest tube output low UOP 40-50 mL/hr Labs okay  Plan: Continue current plan.  May need more volume  Rexene Alberts 03/03/2015 7:59 PM

## 2015-03-03 NOTE — Progress Notes (Signed)
The patient was examined and preop studies reviewed. There has been no change from the prior exam and the patient is ready for surgery. Plan CABG on D [place]

## 2015-03-03 NOTE — Progress Notes (Signed)
  Echocardiogram Echocardiogram Transesophageal has been performed.  Jennette Dubin 03/03/2015, 10:34 AM

## 2015-03-03 NOTE — OR Nursing (Signed)
Forty minute call to SICU charge nurse at 1234.

## 2015-03-03 NOTE — OR Nursing (Signed)
Twenty minute call to SICU charge nurse at 1311.

## 2015-03-03 NOTE — Progress Notes (Signed)
Noticed rhythm change from Mobitz 1 to a junctional rhythm on the monitor for 2H15 [place], Janari. Pt. Reports no chest pain. EKG done and MD notified.  Will continue to monitor.

## 2015-03-03 NOTE — Transfer of Care (Signed)
Immediate Anesthesia Transfer of Care Note  Patient: Joel Berger  Procedure(s) Performed: Procedure(s) with comments: CORONARY ARTERY BYPASS GRAFTING (CABG), ON PUMP, TIMES THREE, USING LEFT INTERNAL MAMMARY ARTERY, RIGHT GREATER SAPHENOUS VEIN HARVESTED ENDOSCOPICALLY (N/A) - LIMA TO LAD, SVG TO DIAGONAL, SVG TO PDA TRANSESOPHAGEAL ECHOCARDIOGRAM (TEE) (N/A)  Patient Location: SICU  Anesthesia Type:General  Level of Consciousness: sedated, unresponsive and Patient remains intubated per anesthesia plan  Airway & Oxygen Therapy: Patient remains intubated per anesthesia plan and Patient placed on Ventilator (see vital sign flow sheet for setting)  Post-op Assessment: Report given to RN and Post -op Vital signs reviewed and stable  Post vital signs: Reviewed and stable  Last Vitals:  Filed Vitals:   03/03/15 1358  BP: 86/44  Pulse: 90  Temp:   Resp: 16    Complications: No apparent anesthesia complications

## 2015-03-03 NOTE — Anesthesia Procedure Notes (Addendum)
Procedure Name: Intubation Date/Time: 03/03/2015 7:54 AM Performed by: Rebekah Chesterfield L Pre-anesthesia Checklist: Patient identified, Emergency Drugs available, Suction available, Patient being monitored and Timeout performed Patient Re-evaluated:Patient Re-evaluated prior to inductionOxygen Delivery Method: Circle system utilized Preoxygenation: Pre-oxygenation with 100% oxygen Intubation Type: IV induction Ventilation: Mask ventilation without difficulty Laryngoscope Size: Mac and 4 Grade View: Grade III Tube type: Oral Tube size: 8.0 mm Number of attempts: 2 Airway Equipment and Method: Stylet Placement Confirmation: ETT inserted through vocal cords under direct vision,  breath sounds checked- equal and bilateral and positive ETCO2 Secured at: 22 cm Tube secured with: Tape Dental Injury: Teeth and Oropharynx as per pre-operative assessment  Comments: Cuff ru(pture with 1st ett placement replaced over bougie stylet s probs sats maintained at 100%     Procedures: Right IJ Gordy Councilman Catheter Insertion: [phone]: The patient was identified and consent obtained.  TO was performed, and full barrier precautions were used.  The skin was anesthetized with lidocaine-4cc plain with 25g needle.  Once the vein was located with the 22 ga. needle using ultrasound guidance , the wire was inserted into the vein.  The wire location was confirmed with ultrasound.  The tissue was dilated and the 8.5 [place] cordis catheter was carefully inserted. Afterwards Gordy Councilman catheter was inserted. PA catheter at 45cm.  The patient tolerated the procedure well.   CE

## 2015-03-03 NOTE — Progress Notes (Signed)
ANTICOAGULATION CONSULT NOTE - Follow Up Consult  Pharmacy Consult for heparin Indication: NSTEMI   Labs:  Recent Labs  03/01/15 0524 03/01/15 0546 03/01/15 1341 03/01/15 1843 03/01/15 2247  03/02/15 0119 03/02/15 0905 03/02/15 1630 03/03/15 0038  HGB 15.0 16.0  --   --   --   --  13.6  --   --  12.1*  HCT 43.2 47.0  --   --   --   --  39.1  --   --  35.2*  PLT 196  --   --   --   --   --  168  --   --  179  APTT  --  29  --   --   --   --   --   --   --   --   LABPROT  --  13.9  --   --   --   --   --   --   --   --   INR  --  1.05  --   --   --   --   --   --   --   --   HEPARINUNFRC  --   --   --   --   --   < > <0.10* 0.13* 0.17* 0.36  CREATININE 0.67 0.70  --   --   --   --  0.69  --  0.76 0.85  TROPONINI  --   --  16.36* 9.76* 11.54*  --   --   --   --   --   < > = values in this interval not displayed.    Assessment/Plan:  54yo male therapeutic on heparin after rate adjustments. Will continue gtt at current rate until off for OR.   Wynona Neat, PharmD, BCPS  03/03/2015,3:00 AM

## 2015-03-03 NOTE — Procedures (Signed)
Extubation Procedure Note  Patient Details:   Name: Joel Berger DOB: [birth year] MRN: [id number]   Airway Documentation:     Evaluation  O2 sats: stable throughout Complications: No apparent complications Patient did tolerate procedure well. Bilateral Breath Sounds: Clear Suctioning: Airway Yes   Patient extubated to 4L nasal cannula per rapid wean protocol.  Positive cuff leak noted.  No evidence of stridor.  NIF of -22, VC of 850.  Sats currently 98%.  Vitals are stable.  Patient able to speak post extubation.  Incentive spirometry performed x5 with achieved goal of 500.  No apparent complications.   Philomena Doheny 03/03/2015, 4:29 PM

## 2015-03-03 NOTE — Brief Op Note (Signed)
03/01/2015 - 03/03/2015  11:49 AM  PATIENT:  Joel Berger  54 y.o. male  PRE-OPERATIVE DIAGNOSIS:  CAD  POST-OPERATIVE DIAGNOSIS:  CAD  PROCEDURE:  Procedure(s):  CORONARY ARTERY BYPASS GRAFTING x 3 -LIMA to LAD -SVG to DIAGONAL -SVG to PDA  ENDOSCOPIC HARVEST GREATER SAPHENOUS VEIN RIGHT THIGH TO BELOW KNEE  TRANSESOPHAGEAL ECHOCARDIOGRAM (TEE) (N/A)  SURGEON:  Surgeon(s) and Role:    * Ivin Poot, MD - Primary  PHYSICIAN ASSISTANT: Ellwood Handler PA-C  ANESTHESIA:   general  EBL:     BLOOD ADMINISTERED: CELLSAVER and 2 FFP  DRAINS: left pleural chest tube, mediastinal chest drains   LOCAL MEDICATIONS USED:  NONE  SPECIMEN:  No Specimen  DISPOSITION OF SPECIMEN:  N/A  COUNTS:  YES  TOURNIQUET:  * No tourniquets in log *  DICTATION: .Dragon Dictation  PLAN OF CARE: Admit to inpatient   PATIENT DISPOSITION:  ICU - intubated and hemodynamically stable.   Delay start of Pharmacological VTE agent (>24hrs) due to surgical blood loss or risk of bleeding: yes

## 2015-03-03 NOTE — Anesthesia Postprocedure Evaluation (Signed)
Anesthesia Post Note  Patient: Joel Berger  Procedure(s) Performed: Procedure(s) (LRB): CORONARY ARTERY BYPASS GRAFTING (CABG), ON PUMP, TIMES THREE, USING LEFT INTERNAL MAMMARY ARTERY, RIGHT GREATER SAPHENOUS VEIN HARVESTED ENDOSCOPICALLY (N/A) TRANSESOPHAGEAL ECHOCARDIOGRAM (TEE) (N/A)  Anesthesia type: General  Patient location: ICU  Post pain: Pain level controlled  Post assessment: Post-op Vital signs reviewed  Last Vitals:  Filed Vitals:   03/03/15 1745  BP:   Pulse: 89  Temp: 37.2 C  Resp: 26    Post vital signs: stable  Level of consciousness: Patient remains intubated per anesthesia plan  Complications: No apparent anesthesia complications

## 2015-03-03 NOTE — Anesthesia Preprocedure Evaluation (Signed)
Anesthesia Evaluation  Patient identified by MRN, date of birth, ID band Patient awake    Reviewed: Allergy & Precautions, NPO status , Patient's Chart, lab work & pertinent test results  Airway Mallampati: I  TM Distance: >3 FB Neck ROM: Full    Dental   Pulmonary    Pulmonary exam normal       Cardiovascular hypertension, Pt. on medications + Past MI Normal cardiovascular exam    Neuro/Psych    GI/Hepatic   Endo/Other  diabetes, Type 2, Oral Hypoglycemic Agents  Renal/GU      Musculoskeletal   Abdominal   Peds  Hematology   Anesthesia Other Findings   Reproductive/Obstetrics                             Anesthesia Physical Anesthesia Plan  ASA: III  Anesthesia Plan: General   Post-op Pain Management:    Induction: Intravenous  Airway Management Planned: Oral ETT  Additional Equipment: Arterial line, CVP, PA Cath, TEE and Ultrasound Guidance Line Placement  Intra-op Plan:   Post-operative Plan: Post-operative intubation/ventilation  Informed Consent: I have reviewed the patients History and Physical, chart, labs and discussed the procedure including the risks, benefits and alternatives for the proposed anesthesia with the patient or authorized representative who has indicated his/her understanding and acceptance.     Plan Discussed with: CRNA and Surgeon  Anesthesia Plan Comments:         Anesthesia Quick Evaluation

## 2015-03-04 ENCOUNTER — Inpatient Hospital Stay (HOSPITAL_COMMUNITY): Payer: 59

## 2015-03-04 ENCOUNTER — Encounter (HOSPITAL_COMMUNITY): Payer: Self-pay | Admitting: Cardiothoracic Surgery

## 2015-03-04 LAB — GLUCOSE, CAPILLARY
GLUCOSE-CAPILLARY: 126 mg/dL — AB (ref 65–99)
GLUCOSE-CAPILLARY: 139 mg/dL — AB (ref 65–99)
GLUCOSE-CAPILLARY: 132 mg/dL — AB (ref 65–99)
GLUCOSE-CAPILLARY: 105 mg/dL — AB (ref 65–99)
GLUCOSE-CAPILLARY: 105 mg/dL — AB (ref 65–99)
GLUCOSE-CAPILLARY: 75 mg/dL (ref 65–99)
GLUCOSE-CAPILLARY: 191 mg/dL — AB (ref 65–99)
Glucose-Capillary: 110 mg/dL — ABNORMAL HIGH (ref 65–99)
Glucose-Capillary: 119 mg/dL — ABNORMAL HIGH (ref 65–99)
Glucose-Capillary: 124 mg/dL — ABNORMAL HIGH (ref 65–99)
Glucose-Capillary: 124 mg/dL — ABNORMAL HIGH (ref 65–99)
Glucose-Capillary: 102 mg/dL — ABNORMAL HIGH (ref 65–99)
Glucose-Capillary: 107 mg/dL — ABNORMAL HIGH (ref 65–99)
Glucose-Capillary: 148 mg/dL — ABNORMAL HIGH (ref 65–99)
Glucose-Capillary: 93 mg/dL (ref 65–99)
Glucose-Capillary: 94 mg/dL (ref 65–99)

## 2015-03-04 LAB — CBC
HEMATOCRIT: 34.2 % — AB (ref 39.0–52.0)
HEMATOCRIT: 33.1 % — AB (ref 39.0–52.0)
HEMOGLOBIN: 11.9 g/dL — AB (ref 13.0–17.0)
HEMOGLOBIN: 11.5 g/dL — AB (ref 13.0–17.0)
MCH: 29.2 pg (ref 26.0–34.0)
MCH: 29.2 pg (ref 26.0–34.0)
MCHC: 34.8 g/dL (ref 30.0–36.0)
MCHC: 34.7 g/dL (ref 30.0–36.0)
MCV: 83.8 fL (ref 78.0–100.0)
MCV: 84 fL (ref 78.0–100.0)
Platelets: 210 10*3/uL (ref 150–400)
Platelets: 189 10*3/uL (ref 150–400)
RBC: 4.08 MIL/uL — AB (ref 4.22–5.81)
RBC: 3.94 MIL/uL — ABNORMAL LOW (ref 4.22–5.81)
RDW: 12.9 % (ref 11.5–15.5)
RDW: 13 % (ref 11.5–15.5)
WBC: 16.3 10*3/uL — ABNORMAL HIGH (ref 4.0–10.5)
WBC: 12 10*3/uL — ABNORMAL HIGH (ref 4.0–10.5)

## 2015-03-04 LAB — PREPARE FRESH FROZEN PLASMA
UNIT DIVISION: 0
UNIT DIVISION: 0

## 2015-03-04 LAB — BASIC METABOLIC PANEL
Anion gap: 8 (ref 5–15)
BUN: 18 mg/dL (ref 6–20)
CALCIUM: 7.8 mg/dL — AB (ref 8.9–10.3)
CHLORIDE: 102 mmol/L (ref 101–111)
CO2: 25 mmol/L (ref 22–32)
CREATININE: 0.61 mg/dL (ref 0.61–1.24)
GFR calc non Af Amer: >60 mL/min (ref >60)
GLUCOSE: 113 mg/dL — AB (ref 65–99)
Potassium: 4.2 mmol/L (ref 3.5–5.1)
Sodium: 135 mmol/L (ref 135–145)

## 2015-03-04 LAB — POCT I-STAT, CHEM 8
BUN: 23 mg/dL — AB (ref 6–20)
CHLORIDE: 100 mmol/L — AB (ref 101–111)
Calcium, Ion: 1.14 mmol/L (ref 1.12–1.23)
Creatinine, Ser: 0.6 mg/dL — ABNORMAL LOW (ref 0.61–1.24)
Glucose, Bld: 193 mg/dL — ABNORMAL HIGH (ref 65–99)
HCT: 33 % — ABNORMAL LOW (ref 39.0–52.0)
Hemoglobin: 11.2 g/dL — ABNORMAL LOW (ref 13.0–17.0)
POTASSIUM: 3.8 mmol/L (ref 3.5–5.1)
SODIUM: 134 mmol/L — AB (ref 135–145)
TCO2: 22 mmol/L (ref 0–100)

## 2015-03-04 LAB — CREATININE, SERUM
Creatinine, Ser: 0.73 mg/dL (ref 0.61–1.24)
GFR calc Af Amer: >60 mL/min (ref >60)

## 2015-03-04 LAB — MAGNESIUM
Magnesium: 2.7 mg/dL — ABNORMAL HIGH (ref 1.7–2.4)
Magnesium: 2.5 mg/dL — ABNORMAL HIGH (ref 1.7–2.4)

## 2015-03-04 LAB — HEMOGLOBIN A1C
Hgb A1c MFr Bld: 7.9 % — ABNORMAL HIGH (ref 4.8–5.6)
Mean Plasma Glucose: 180 mg/dL

## 2015-03-04 MED ORDER — DOPAMINE-DEXTROSE 3.2-5 MG/ML-% IV SOLN: 3.0000 ug/kg/min | INTRAVENOUS | Status: DC

## 2015-03-04 MED ORDER — INSULIN DETEMIR 100 UNIT/ML ~~LOC~~ SOLN
18.0000 [IU] | Freq: Two times a day (BID) | SUBCUTANEOUS | Status: DC
  Administered 2015-03-04 – 2015-03-08 (×9): 18 [IU] via SUBCUTANEOUS
  Filled 2015-03-04 (×10): qty 0.18

## 2015-03-04 MED ORDER — HYDROMORPHONE HCL 1 MG/ML IJ SOLN
1.0000 mg | INTRAMUSCULAR | Status: DC | PRN
Start: 2015-03-04 — End: 2015-03-05
  Administered 2015-03-04: 1 mg via INTRAVENOUS
  Filled 2015-03-04: qty 1

## 2015-03-04 MED ORDER — INSULIN ASPART 100 UNIT/ML ~~LOC~~ SOLN
0.0000 [IU] | SUBCUTANEOUS | Status: DC
  Administered 2015-03-04 (×3): 4 [IU] via SUBCUTANEOUS
  Administered 2015-03-05: 2 [IU] via SUBCUTANEOUS

## 2015-03-04 MED ORDER — POTASSIUM CHLORIDE 10 MEQ/50ML IV SOLN
10.0000 meq | INTRAVENOUS | Status: AC
  Administered 2015-03-04 – 2015-03-05 (×2): 10 meq via INTRAVENOUS
  Filled 2015-03-04 (×2): qty 50

## 2015-03-04 MED ORDER — FUROSEMIDE 10 MG/ML IJ SOLN
20.0000 mg | Freq: Two times a day (BID) | INTRAMUSCULAR | Status: DC
  Administered 2015-03-04: 20 mg via INTRAVENOUS
  Filled 2015-03-04: qty 2

## 2015-03-04 MED ORDER — MILRINONE IN DEXTROSE 20 MG/100ML IV SOLN
0.3000 ug/kg/min | INTRAVENOUS | Status: DC
  Administered 2015-03-04: 0.3 ug/kg/min via INTRAVENOUS
  Filled 2015-03-04: qty 100

## 2015-03-04 MED ORDER — FUROSEMIDE 10 MG/ML IJ SOLN
40.0000 mg | Freq: Two times a day (BID) | INTRAMUSCULAR | Status: DC
  Administered 2015-03-04 – 2015-03-08 (×8): 40 mg via INTRAVENOUS
  Filled 2015-03-04 (×12): qty 4

## 2015-03-04 MED ORDER — METOCLOPRAMIDE HCL 5 MG/ML IJ SOLN
10.0000 mg | Freq: Four times a day (QID) | INTRAMUSCULAR | Status: AC
  Administered 2015-03-04 – 2015-03-06 (×10): 10 mg via INTRAVENOUS
  Filled 2015-03-04 (×16): qty 2

## 2015-03-04 NOTE — Op Note (Signed)
[name], Berger NO.:  [id number]  MEDICAL RECORD NO.:  [id number]  LOCATION:  2W22C                        FACILITY:  [facility]  PHYSICIAN:  Ivin Poot, M.D.  DATE OF BIRTH:  1961/02/11  DATE OF PROCEDURE:  03/03/2015 DATE OF DISCHARGE:  03/08/2015                              OPERATIVE REPORT   OPERATION: 1. Coronary artery bypass grafting x3 (left internal mammary artery to     LAD, saphenous vein graft to first diagonal, saphenous vein graft     to posterior descending). 2. Endoscopic harvest of right leg greater saphenous vein.  SURGEON:  Ivin Poot, M.D.  ASSISTANT:  Ellwood Handler, PA-C  PREOPERATIVE DIAGNOSIS:  Post infarction unstable angina with severe multivessel coronary artery disease, mild-moderate LV dysfunction.  POSTOPERATIVE DIAGNOSIS:  Post infarction unstable angina with severe multivessel coronary artery disease, mild-moderate LV dysfunction.  ANESTHESIA:  General by Dr. Lillia Abed.  INDICATIONS:  The patient is a 54 year old diabetic, who presented to the hospital over 48 hours after onset of shortness of breath, heartburn, and then chest pain.  Cardiac enzymes were markedly positive, and his EKG showed ST-segment changes.  He underwent urgent cardiac catheterization which demonstrated a total occlusion of the distal right coronary posterolateral vessel.  The proximal RCA at the origin of the posterior descending also had an 80% stenosis.  The LAD diagonal had an 80%-90% stenosis.  The left main had no significant disease.  The circumflex had minimal disease.  Overall, EF was 45%.  Surgical coronary revascularization was recommended.  Prior to surgery, I examined the patient in the CCU and reviewed results of his cardiac catheterization with the family and the patient.  I discussed indications and expected benefits of coronary artery bypass surgery for treatment of his severe coronary artery disease.  I reviewed the  alternatives to surgical therapy.  I discussed the major aspects of surgery including location of the surgical incision, use of general anesthesia, and cardiopulmonary bypass, and the expected postoperative hospital recovery.  I discussed with him the specific risks of coronary artery bypass surgery that he would face including risks of stroke, bleeding, MI, postoperative pulmonary problems including pleural effusion, wound infection, multiorgan failure, and death.  After reviewing these issues, he demonstrated his understanding and agreed to proceed with surgery under what I felt was an informed consent.  OPERATIVE FINDINGS: 1. Adequate conduit. 2. Severe pericarditis post MI, Dressler syndrome with edema and     inflammation of the entire epicardium making location of the     vessels for grafting difficult. 3. No packed cell transfusion required for the surgery. 4. Global LV function was improved after separation from     cardiopulmonary bypass after CABG x3.  PROCEDURE IN DETAIL:  The patient was brought to the operating room and placed supine on the operating table where general anesthesia was induced under invasive hemodynamic monitor.  A transesophageal echo probe was placed by the anesthesiologist.  The chest, abdomen, and legs were prepped with Betadine and draped as a sterile field.  A sternal incision was made as the saphenous vein was harvested endoscopically from the right leg.  The left internal mammary  artery was harvested as a pedicle graft from its origin at the subclavian vessels.  There was a 1.5-mm vessel with excellent flow.  The sternal retractor was placed, and the pericardium was opened and suspended.  Pursestrings were placed in the ascending aorta and right atrium and heparin was administered. When the ACT was documented as being therapeutic, the patient was cannulated and placed on cardiopulmonary bypass.  The coronaries were identified for grafting.  There  was severe diffuse erythema, inflammation, and edema of the epicardium.  The MI infarct territory was in the posterolateral wall and was very inflamed and edematous.  The posterolateral branch of the distal right was dissected out, but it was too small for the graft.  Cardioplegia cannulas were placed both antegrade and retrograde cold blood cardioplegia, and the patient was cooled to 32 degrees.  The aortic cross-clamp was applied and a liter of cold blood cardioplegia was delivered in split doses between the antegrade aortic and retrograde coronary sinus catheters.  There was good cardioplegic arrest and septal temperature dropped less than 14 degrees.  Cardioplegia was delivered every 20 minutes.  The distal coronary anastomoses were performed.  The first distal anastomosis was to the posterior descending branch to right coronary. There was a proximal 80% stenosis.  A reverse saphenous vein was sewn end-to-side with running 7-0 Prolene to this 1.75-mm vessel with excellent flow.  Cardioplegia was redosed.  The second distal anastomosis was to the first diagonal branch to LAD. This had a proximal 80%-90% stenosis.  A reverse saphenous vein was sewn end-to-side with running 7-0 Prolene with excellent flow through the graft.  Cardioplegia was redosed.  The third distal anastomosis was to the distal third of the LAD using the left IMA graft.  The IMA pedicle was brought through an opening in the left lateral pericardium, was brought down onto the LAD and sewn end- to-side with a running 8-0 Prolene.  There was good flow through the anastomosis after briefly releasing bulldog on the mammary artery.  The bulldog was reapplied and the pedicle was secured to epicardium with 6-0 Prolene.  Cardioplegia was redosed.  While the cross-clamp was still in place, 2 proximal vein anastomoses were performed on the ascending aorta using a 4.5-mm punch and running 6- 0 Prolene.  Prior to tying down  the final proximal anastomosis, air was vented from the coronaries with a dose of retrograde warm blood cardioplegia.  The cross-clamp was removed.  The heart was cardioverted back to a regular rhythm.  The vein grafts were de-aired and opened and each had good flow.  Hemostasis was documented at the proximal and distal anastomoses.  Temporary pacing wires were applied.  The lungs were expanded and ventilator was resumed. The patient was started on low-dose dopamine and milrinone and then weaned successfully off cardiopulmonary bypass with excellent hemodynamics and improved global LV function by echo.  Protamine was administered without adverse reaction.  The cannulas were removed.  The mediastinum was irrigated.  The superior pericardial fat was closed over the aorta and vein grafts.  Anterior mediastinal and left pleural chest tube were placed and brought out through separate incisions.  The sternum was closed with interrupted steel wire.  The pectoralis fascia and subcutaneous layers were closed in running Vicryl.  The skin was closed with a subcuticular suture.  The leg incisions were closed in a standard technique.  Total cardiopulmonary bypass time was 110 minutes.  The patient returned to the ICU in stable condition.  Ivin Poot, M.D.     PV/MEDQ  D:  03/03/2015  T:  03/04/2015  Job:  [id number]  cc:   Fransico Him, M.D.

## 2015-03-04 NOTE — Op Note (Deleted)
NAMEROBEL, WUERTZ NO.:  [id number]  MEDICAL RECORD NO.:  [id number]  LOCATION:  [facility]                       FACILITY:  [facility]  PHYSICIAN:  Ivin Poot, M.D.  DATE OF BIRTH:  [birth year]  DATE OF PROCEDURE:  03/03/2015 DATE OF DISCHARGE:  03/03/2015                              OPERATIVE REPORT   OPERATION: 1. Coronary artery bypass grafting x3 (left internal mammary artery to     LAD, saphenous vein graft to first diagonal, saphenous vein graft     to posterior descending). 2. Endoscopic harvest of right leg greater saphenous vein.  SURGEON:  Ivin Poot, M.D.  ASSISTANT:  Ellwood Handler, PA-C  PREOPERATIVE DIAGNOSIS:  Post infarction unstable angina with severe multivessel coronary artery disease, mild-moderate LV dysfunction.  POSTOPERATIVE DIAGNOSIS:  Post infarction unstable angina with severe multivessel coronary artery disease, mild-moderate LV dysfunction.  ANESTHESIA:  General by Dr. Lillia Abed.  INDICATIONS:  The patient is a 54 year old diabetic, who presented to the hospital over 48 hours after onset of shortness of breath, heartburn, and then chest pain.  Cardiac enzymes were markedly positive, and his EKG showed ST-segment changes.  He underwent urgent cardiac catheterization which demonstrated a total occlusion of the distal right coronary posterolateral vessel.  The proximal RCA at the origin of the posterior descending also had an 80% stenosis.  The LAD diagonal had an 80%-90% stenosis.  The left main had no significant disease.  The circumflex had minimal disease.  Overall, EF was 45%.  Surgical coronary revascularization was recommended.  Prior to surgery, I examined the patient in the CCU and reviewed results of his cardiac catheterization with the family and the patient.  I discussed indications and expected benefits of coronary artery bypass surgery for treatment of his severe coronary artery disease.  I reviewed the  alternatives to surgical therapy.  I discussed the major aspects of surgery including location of the surgical incision, use of general anesthesia, and cardiopulmonary bypass, and the expected postoperative hospital recovery.  I discussed with him the specific risks of coronary artery bypass surgery that he would face including risks of stroke, bleeding, MI, postoperative pulmonary problems including pleural effusion, wound infection, multiorgan failure, and death.  After reviewing these issues, he demonstrated his understanding and agreed to proceed with surgery under what I felt was an informed consent.  OPERATIVE FINDINGS: 1. Adequate conduit. 2. Severe pericarditis post MI, Dressler syndrome with edema and     inflammation of the entire epicardium making location of the     vessels for grafting difficult. 3. No packed cell transfusion required for the surgery. 4. Global LV function was improved after separation from     cardiopulmonary bypass after CABG x3.  PROCEDURE IN DETAIL:  The patient was brought to the operating room and placed supine on the operating table where general anesthesia was induced under invasive hemodynamic monitor.  A transesophageal echo probe was placed by the anesthesiologist.  The chest, abdomen, and legs were prepped with Betadine and draped as a sterile field.  A sternal incision was made as the saphenous vein was harvested endoscopically from the right leg.  The left internal mammary artery  was harvested as a pedicle graft from its origin at the subclavian vessels.  There was a 1.5-mm vessel with excellent flow.  The sternal retractor was placed, and the pericardium was opened and suspended.  Pursestrings were placed in the ascending aorta and right atrium and heparin was administered. When the ACT was documented as being therapeutic, the patient was cannulated and placed on cardiopulmonary bypass.  The coronaries were identified for grafting.  There  was severe diffuse erythema, inflammation, and edema of the epicardium.  The MI infarct territory was in the posterolateral wall and was very inflamed and edematous.  The posterolateral branch of the distal right was dissected out, but it was too small for the graft.  Cardioplegia cannulas were placed both antegrade and retrograde cold blood cardioplegia, and the patient was cooled to 32 degrees.  The aortic cross-clamp was applied and a liter of cold blood cardioplegia was delivered in split doses between the antegrade aortic and retrograde coronary sinus catheters.  There was good cardioplegic arrest and septal temperature dropped less than 14 degrees.  Cardioplegia was delivered every 20 minutes.  The distal coronary anastomoses were performed.  The first distal anastomosis was to the posterior descending branch to right coronary. There was a proximal 80% stenosis.  A reverse saphenous vein was sewn end-to-side with running 7-0 Prolene to this 1.75-mm vessel with excellent flow.  Cardioplegia was redosed.  The second distal anastomosis was to the first diagonal branch to LAD. This had a proximal 80%-90% stenosis.  A reverse saphenous vein was sewn end-to-side with running 7-0 Prolene with excellent flow through the graft.  Cardioplegia was redosed.  The third distal anastomosis was to the distal third of the LAD using the left IMA graft.  The IMA pedicle was brought through an opening in the left lateral pericardium, was brought down onto the LAD and sewn end- to-side with a running 8-0 Prolene.  There was good flow through the anastomosis after briefly releasing bulldog on the mammary artery.  The bulldog was reapplied and the pedicle was secured to epicardium with 6-0 Prolene.  Cardioplegia was redosed.  While the cross-clamp was still in place, 2 proximal vein anastomoses were performed on the ascending aorta using a 4.5-mm punch and running 6- 0 Prolene.  Prior to tying down  the final proximal anastomosis, air was vented from the coronaries with a dose of retrograde warm blood cardioplegia.  The cross-clamp was removed.  The heart was cardioverted back to a regular rhythm.  The vein grafts were de-aired and opened and each had good flow.  Hemostasis was documented at the proximal and distal anastomoses.  Temporary pacing wires were applied.  The lungs were expanded and ventilator was resumed. The patient was started on low-dose dopamine and milrinone and then weaned successfully off cardiopulmonary bypass with excellent hemodynamics and improved global LV function by echo.  Protamine was administered without adverse reaction.  The cannulas were removed.  The mediastinum was irrigated.  The superior pericardial fat was closed over the aorta and vein grafts.  Anterior mediastinal and left pleural chest tube were placed and brought out through separate incisions.  The sternum was closed with interrupted steel wire.  The pectoralis fascia and subcutaneous layers were closed in running Vicryl.  The skin was closed with a subcuticular suture.  The leg incisions were closed in a standard technique.  Total cardiopulmonary bypass time was 110 minutes.  The patient returned to the ICU in stable condition.  Ivin Poot, M.D.     PV/MEDQ  D:  03/03/2015  T:  03/04/2015  Job:  [id number]  cc:   Fransico Him, M.D.

## 2015-03-04 NOTE — Care Management Note (Signed)
Case Management Note  Patient Details  Name: Joel Berger MRN: [id number] Date of Birth: 1961/03/17  Subjective/Objective:     Pt lives with spouse, states she works full-time but plans to take [employer] so that she can provide 24/7 assistance to him when he is medically stable for discharge.               Action/Plan: CM will follow for d/c needs  Expected Discharge Plan:  Home/Self Care  In-House Referral:     Discharge planning Services  CM Consult  Post Acute Care Choice:    Choice offered to:     DME Arranged:    DME Agency:     HH Arranged:    HH Agency:     Status of Service:  In process, will continue to follow  Medicare Important Message Given:    Date Medicare IM Given:    Medicare IM give by:    Date Additional Medicare IM Given:    Additional Medicare Important Message give by:     If discussed at [place] of Stay Meetings, dates discussed:    Additional Comments:  Girard Cooter, RN 03/04/2015, 3:23 PM

## 2015-03-04 NOTE — Progress Notes (Addendum)
1 Day Post-Op Procedure(s) (LRB): CORONARY ARTERY BYPASS GRAFTING (CABG), ON PUMP, TIMES THREE, USING LEFT INTERNAL MAMMARY ARTERY, RIGHT GREATER SAPHENOUS VEIN HARVESTED ENDOSCOPICALLY (N/A) TRANSESOPHAGEAL ECHOCARDIOGRAM (TEE) (N/A) Subjective: Urgent CABG post DMI Severe post MI pericarditis Post MI CHF and transient 2nd degree heart block Untreated DM Objective: Vital signs in last 24 hours: Temp:  [98.6 F (37 C)-100.2 F (37.9 C)] 99.3 F (37.4 C) (08/31 0800) Pulse Rate:  [87-91] 90 (08/31 0800) Cardiac Rhythm:  [-] A-V Sequential paced (08/30 2000) Resp:  [0-34] 28 (08/31 0800) BP: (86-133)/(44-87) 117/70 mmHg (08/31 0800) SpO2:  [91 %-100 %] 100 % (08/31 0800) Arterial Line BP: (84-149)/(47-71) 141/62 mmHg (08/31 0800) FiO2 (%):  [40 %-50 %] 40 % (08/30 1510) Weight:  [216 lb 14.9 oz (98.4 kg)] 216 lb 14.9 oz (98.4 kg) (08/31 0700)  Hemodynamic parameters for last 24 hours: PAP: (26-45)/(16-28) 35/19 mmHg CO:  [4.1 L/min-5.9 L/min] 5.6 L/min CI:  [1.9 L/min/m2-3.8 L/min/m2] 2.6 L/min/m2  Intake/Output from previous day: 08/30 0701 - 08/31 0700 In: 6842.7 [I.V.:4282.7; Blood:1160; IV Piggyback:1400] Out: 2230 [Urine:1720; Chest Tube:510] Intake/Output this shift: Total I/O In: -  Out: 85 [Urine:25; Chest Tube:60]  nsr Neuro intact  Lab Results:  Recent Labs  03/03/15 2031 03/04/15 0413  WBC 16.8* 16.3*  HGB 11.7* 11.9*  HCT 33.6* 34.2*  PLT 205 210   BMET:  Recent Labs  03/03/15 0038  03/03/15 2012 03/03/15 2032 03/04/15 0413  NA 129*  < > 136  --  135  K 3.2*  < > 4.5  --  4.2  CL 95*  < > 101  --  102  CO2 25  --   --   --  25  GLUCOSE 230*  < > 138*  --  113*  BUN 17  < > 19  --  18  CREATININE 0.85  < > 0.60* 0.62 0.61  CALCIUM 8.1*  --   --   --  7.8*  < > = values in this interval not displayed.  PT/INR:  Recent Labs  03/03/15 1418  LABPROT 18.5*  INR 1.54*   ABG    Component Value Date/Time   PHART 7.381 03/03/2015 1716   HCO3 25.9* 03/03/2015 1716   TCO2 25 03/03/2015 2012   O2SAT 97.0 03/03/2015 1716   CBG (last 3)   Recent Labs  03/04/15 0021 03/04/15 0123 03/04/15 0225  GLUCAP 124* 124* 110*    Assessment/Plan: S/P Procedure(s) (LRB): CORONARY ARTERY BYPASS GRAFTING (CABG), ON PUMP, TIMES THREE, USING LEFT INTERNAL MAMMARY ARTERY, RIGHT GREATER SAPHENOUS VEIN HARVESTED ENDOSCOPICALLY (N/A) TRANSESOPHAGEAL ECHOCARDIOGRAM (TEE) (N/A) Mobilize Diuresis Diabetes control d/c pacing wires See progression orders  Continue dopamine, milrinone today   LOS: 3 days    Joel Berger 03/04/2015

## 2015-03-04 NOTE — Progress Notes (Signed)
Patient ID: Joel Berger, male   DOB: 1960-08-25, 54 y.o.   MRN: [id number]  SICU Evening Rounds:  Hemodynamically stable on milrinone 0.3 and dop 1. Plan to wean off overnight.  Received lasix twice but not much diuresis.   CT output low. To be removed tonight.  CBC    Component Value Date/Time   WBC 12.0* 03/04/2015 1654   RBC 3.94* 03/04/2015 1654   HGB 11.5* 03/04/2015 1654   HCT 33.1* 03/04/2015 1654   PLT 189 03/04/2015 1654   MCV 84.0 03/04/2015 1654   MCH 29.2 03/04/2015 1654   MCHC 34.7 03/04/2015 1654   RDW 13.0 03/04/2015 1654    BMET    Component Value Date/Time   NA 134* 03/04/2015 1646   K 3.8 03/04/2015 1646   CL 100* 03/04/2015 1646   CO2 25 03/04/2015 0413   GLUCOSE 193* 03/04/2015 1646   BUN 23* 03/04/2015 1646   CREATININE 0.73 03/04/2015 1654   CALCIUM 7.8* 03/04/2015 0413   GFRNONAA >60 03/04/2015 1654   GFRAA >60 03/04/2015 1654

## 2015-03-05 ENCOUNTER — Inpatient Hospital Stay (HOSPITAL_COMMUNITY): Payer: 59

## 2015-03-05 DIAGNOSIS — I25111 Atherosclerotic heart disease of native coronary artery with angina pectoris with documented spasm: Secondary | ICD-10-CM

## 2015-03-05 LAB — CBC
HCT: 30.9 % — ABNORMAL LOW (ref 39.0–52.0)
Hemoglobin: 10.4 g/dL — ABNORMAL LOW (ref 13.0–17.0)
MCH: 28.7 pg (ref 26.0–34.0)
MCHC: 33.7 g/dL (ref 30.0–36.0)
MCV: 85.4 fL (ref 78.0–100.0)
Platelets: 160 10*3/uL (ref 150–400)
RBC: 3.62 MIL/uL — ABNORMAL LOW (ref 4.22–5.81)
RDW: 13.2 % (ref 11.5–15.5)
WBC: 8.4 10*3/uL (ref 4.0–10.5)

## 2015-03-05 LAB — GLUCOSE, CAPILLARY
GLUCOSE-CAPILLARY: 128 mg/dL — AB (ref 65–99)
GLUCOSE-CAPILLARY: 195 mg/dL — AB (ref 65–99)
GLUCOSE-CAPILLARY: 79 mg/dL (ref 65–99)
GLUCOSE-CAPILLARY: 174 mg/dL — AB (ref 65–99)
GLUCOSE-CAPILLARY: 151 mg/dL — AB (ref 65–99)
Glucose-Capillary: 145 mg/dL — ABNORMAL HIGH (ref 65–99)
Glucose-Capillary: 157 mg/dL — ABNORMAL HIGH (ref 65–99)
Glucose-Capillary: 168 mg/dL — ABNORMAL HIGH (ref 65–99)
Glucose-Capillary: 174 mg/dL — ABNORMAL HIGH (ref 65–99)
Glucose-Capillary: 84 mg/dL (ref 65–99)

## 2015-03-05 LAB — BASIC METABOLIC PANEL
Anion gap: 6 (ref 5–15)
BUN: 27 mg/dL — ABNORMAL HIGH (ref 6–20)
CO2: 28 mmol/L (ref 22–32)
Calcium: 7.8 mg/dL — ABNORMAL LOW (ref 8.9–10.3)
Chloride: 100 mmol/L — ABNORMAL LOW (ref 101–111)
Creatinine, Ser: 0.76 mg/dL (ref 0.61–1.24)
GFR calc Af Amer: >60 mL/min (ref >60)
GFR calc non Af Amer: >60 mL/min (ref >60)
Glucose, Bld: 95 mg/dL (ref 65–99)
Potassium: 3.8 mmol/L (ref 3.5–5.1)
Sodium: 134 mmol/L — ABNORMAL LOW (ref 135–145)

## 2015-03-05 LAB — CARBOXYHEMOGLOBIN
Carboxyhemoglobin: 1.8 % — ABNORMAL HIGH (ref 0.5–1.5)
Methemoglobin: 1 % (ref 0.0–1.5)
O2 Saturation: 60.4 %
Total hemoglobin: 11.9 g/dL — ABNORMAL LOW (ref 13.5–18.0)

## 2015-03-05 MED ORDER — SODIUM CHLORIDE 0.9 % IJ SOLN
3.0000 mL | Freq: Two times a day (BID) | INTRAMUSCULAR | Status: DC
  Administered 2015-03-05 – 2015-03-07 (×4): 3 mL via INTRAVENOUS

## 2015-03-05 MED ORDER — MAGNESIUM HYDROXIDE 400 MG/5ML PO SUSP: 30.0000 mL | Freq: Every day | ORAL | Status: DC | PRN

## 2015-03-05 MED ORDER — ZOLPIDEM TARTRATE 5 MG PO TABS: 5.0000 mg | ORAL_TABLET | Freq: Every evening | ORAL | Status: DC | PRN

## 2015-03-05 MED ORDER — INSULIN ASPART 100 UNIT/ML ~~LOC~~ SOLN: 0.0000 [IU] | Freq: Three times a day (TID) | SUBCUTANEOUS | Status: DC

## 2015-03-05 MED ORDER — MOVING RIGHT ALONG BOOK
Freq: Once | Status: AC
  Administered 2015-03-05: 1
  Filled 2015-03-05: qty 1

## 2015-03-05 MED ORDER — SODIUM CHLORIDE 0.9 % IJ SOLN: 3.0000 mL | INTRAMUSCULAR | Status: DC | PRN

## 2015-03-05 MED ORDER — POTASSIUM CHLORIDE CRYS ER 20 MEQ PO TBCR
20.0000 meq | EXTENDED_RELEASE_TABLET | Freq: Every day | ORAL | Status: DC
  Administered 2015-03-05 – 2015-03-08 (×4): 20 meq via ORAL
  Filled 2015-03-05 (×5): qty 1

## 2015-03-05 MED ORDER — INSULIN ASPART 100 UNIT/ML ~~LOC~~ SOLN
0.0000 [IU] | Freq: Three times a day (TID) | SUBCUTANEOUS | Status: DC
  Administered 2015-03-05 – 2015-03-06 (×3): 3 [IU] via SUBCUTANEOUS
  Administered 2015-03-06 – 2015-03-07 (×3): 2 [IU] via SUBCUTANEOUS
  Administered 2015-03-08: 3 [IU] via SUBCUTANEOUS

## 2015-03-05 MED ORDER — SODIUM CHLORIDE 0.9 % IV SOLN: 250.0000 mL | INTRAVENOUS | Status: DC | PRN

## 2015-03-05 NOTE — Progress Notes (Signed)
2 Days Post-Op Procedure(s) (LRB): CORONARY ARTERY BYPASS GRAFTING (CABG), ON PUMP, TIMES THREE, USING LEFT INTERNAL MAMMARY ARTERY, RIGHT GREATER SAPHENOUS VEIN HARVESTED ENDOSCOPICALLY (N/A) TRANSESOPHAGEAL ECHOCARDIOGRAM (TEE) (N/A) Subjective: Doing well DMI  Then CABG Off drips, urine output low with nl creat- stop toradol Objective: Vital signs in last 24 hours: Temp:  [98.1 F (36.7 C)-99.3 F (37.4 C)] 98.1 F (36.7 C) (09/01 0801) Pulse Rate:  [87-120] 92 (09/01 0800) Cardiac Rhythm:  [-] Ventricular paced (09/01 0800) Resp:  [17-37] 28 (09/01 0800) BP: (84-134)/(54-83) 96/60 mmHg (09/01 0800) SpO2:  [89 %-100 %] 97 % (09/01 0800) Arterial Line BP: (96-167)/(39-80) 101/59 mmHg (08/31 1845) Weight:  [218 lb 14.7 oz (99.3 kg)] 218 lb 14.7 oz (99.3 kg) (09/01 0600)  Hemodynamic parameters for last 24 hours:  stable  Intake/Output from previous day: 08/31 0701 - 09/01 0700 In: 1014.9 [I.V.:814.9; IV Piggyback:200] Out: 1065 [Urine:875; Chest Tube:190] Intake/Output this shift: Total I/O In: 20 [I.V.:20] Out: -   Lungs clear abd soft   Lab Results:  Recent Labs  03/04/15 1654 03/05/15 0431  WBC 12.0* 8.4  HGB 11.5* 10.4*  HCT 33.1* 30.9*  PLT 189 160   BMET:  Recent Labs  03/04/15 0413 03/04/15 1646 03/04/15 1654 03/05/15 0431  NA 135 134*  --  134*  K 4.2 3.8  --  3.8  CL 102 100*  --  100*  CO2 25  --   --  28  GLUCOSE 113* 193*  --  95  BUN 18 23*  --  27*  CREATININE 0.61 0.60* 0.73 0.76  CALCIUM 7.8*  --   --  7.8*    PT/INR:  Recent Labs  03/03/15 1418  LABPROT 18.5*  INR 1.54*   ABG    Component Value Date/Time   PHART 7.381 03/03/2015 1716   HCO3 25.9* 03/03/2015 1716   TCO2 22 03/04/2015 1646   O2SAT 97.0 03/03/2015 1716   CBG (last 3)   Recent Labs  03/04/15 2004 03/05/15 0022 03/05/15 0410  GLUCAP 191* 145* 84    Assessment/Plan: S/P Procedure(s) (LRB): CORONARY ARTERY BYPASS GRAFTING (CABG), ON PUMP, TIMES  THREE, USING LEFT INTERNAL MAMMARY ARTERY, RIGHT GREATER SAPHENOUS VEIN HARVESTED ENDOSCOPICALLY (N/A) TRANSESOPHAGEAL ECHOCARDIOGRAM (TEE) (N/A) Mobilize Diuresis Diabetes control d/c tubes/lines Plan for transfer to step-down: see transfer orders   LOS: 4 days    Joel Berger 03/05/2015

## 2015-03-06 ENCOUNTER — Inpatient Hospital Stay (HOSPITAL_COMMUNITY): Payer: 59

## 2015-03-06 LAB — CBC
HCT: 29.4 % — ABNORMAL LOW (ref 39.0–52.0)
Hemoglobin: 9.9 g/dL — ABNORMAL LOW (ref 13.0–17.0)
MCH: 28.7 pg (ref 26.0–34.0)
MCHC: 33.7 g/dL (ref 30.0–36.0)
MCV: 85.2 fL (ref 78.0–100.0)
Platelets: 199 10*3/uL (ref 150–400)
RBC: 3.45 MIL/uL — ABNORMAL LOW (ref 4.22–5.81)
RDW: 13.2 % (ref 11.5–15.5)
WBC: 8.2 10*3/uL (ref 4.0–10.5)

## 2015-03-06 LAB — BASIC METABOLIC PANEL
Anion gap: 7 (ref 5–15)
BUN: 27 mg/dL — ABNORMAL HIGH (ref 6–20)
CO2: 27 mmol/L (ref 22–32)
Calcium: 7.6 mg/dL — ABNORMAL LOW (ref 8.9–10.3)
Chloride: 97 mmol/L — ABNORMAL LOW (ref 101–111)
Creatinine, Ser: 0.77 mg/dL (ref 0.61–1.24)
GFR calc Af Amer: >60 mL/min (ref >60)
GFR calc non Af Amer: >60 mL/min (ref >60)
Glucose, Bld: 154 mg/dL — ABNORMAL HIGH (ref 65–99)
Potassium: 3.8 mmol/L (ref 3.5–5.1)
Sodium: 131 mmol/L — ABNORMAL LOW (ref 135–145)

## 2015-03-06 LAB — GLUCOSE, CAPILLARY
GLUCOSE-CAPILLARY: 134 mg/dL — AB (ref 65–99)
GLUCOSE-CAPILLARY: 147 mg/dL — AB (ref 65–99)
GLUCOSE-CAPILLARY: 146 mg/dL — AB (ref 65–99)
Glucose-Capillary: 170 mg/dL — ABNORMAL HIGH (ref 65–99)

## 2015-03-06 LAB — TYPE AND SCREEN
ABO/RH(D): O POS
Antibody Screen: NEGATIVE
Unit division: 0
Unit division: 0

## 2015-03-06 MED ORDER — LACTULOSE 10 GM/15ML PO SOLN
20.0000 g | Freq: Every day | ORAL | Status: DC | PRN
  Administered 2015-03-06: 20 g via ORAL
  Filled 2015-03-06: qty 30

## 2015-03-06 NOTE — Discharge Instructions (Signed)
Endoscopic Saphenous Vein Harvesting °Care After °Refer to this sheet in the next few weeks. These instructions provide you with information on caring for yourself after your procedure. Your health care provider may also give you more specific instructions. Your treatment has been planned according to current medical practices, but problems sometimes occur. Call your health care provider if you have any problems or questions after your procedure. °HOME CARE INSTRUCTIONS °Medicine °· Take whatever pain medicine your surgeon prescribes. Follow the directions carefully. Do not take over-the-counter pain medicine unless your surgeon says it is okay. Some pain medicine can cause bleeding problems for several weeks after surgery. °· Follow your surgeon's instructions about driving. You will probably not be permitted to drive after heart surgery. °· Take any medicines your surgeon prescribes. Any medicines you took before your heart surgery should be checked with your health care provider before you start taking them again. °Wound care °· If your surgeon has prescribed an elastic bandage or stocking, ask how long you should wear it. °· Check the area around your surgical cuts (incisions) whenever your bandages (dressings) are changed. Look for any redness or swelling. °· You will need to return to have the stitches (sutures) or staples taken out. Ask your surgeon when to do that. °· Ask your surgeon when you can shower or bathe. °Activity °· Try to keep your legs raised when you are sitting. °· Do any exercises your health care providers have given you. These may include deep breathing exercises, coughing, walking, or other exercises. °SEEK MEDICAL CARE IF: °· You have any questions about your medicines. °· You have more leg pain, especially if your pain medicine stops working. °· New or growing bruises develop on your leg. °· Your leg swells, feels tight, or becomes red. °· You have numbness in your leg. °SEEK IMMEDIATE  MEDICAL CARE IF: °· Your pain gets much worse. °· Blood or fluid leaks from any of the incisions. °· Your incisions become warm, swollen, or red. °· You have chest pain. °· You have trouble breathing. °· You have a fever. °· You have more pain near your leg incision. °MAKE SURE YOU: °· Understand these instructions. °· Will watch your condition. °· Will get help right away if you are not doing well or get worse. °Document Released: 03/02/2011 Document Revised: 06/25/2013 Document Reviewed: 03/02/2011 °ExitCare® Patient Information ©2015 [employer]. This information is not intended to replace advice given to you by your health care provider. Make sure you discuss any questions you have with your health care provider. °Coronary Artery Bypass Grafting, Care After °These instructions give you information on caring for yourself after your procedure. Your doctor may also give you more specific instructions. Call your doctor if you have any problems or questions after your procedure.  °HOME CARE °· Only take medicine as told by your doctor. Take medicines exactly as told. Do not stop taking medicines or start any new medicines without talking to your doctor first. °· Take your pulse as told by your doctor. °· Do deep breathing as told by your doctor. Use your breathing device (incentive spirometer), if given, to practice deep breathing several times a day. Support your chest with a pillow or your arms when you take deep breaths or cough. °· Keep the area clean, dry, and protected where the surgery cuts (incisions) were made. Remove bandages (dressings) only as told by your doctor. If strips were applied to surgical area, do not take them off. They fall off   on their own. °· Check the surgery area daily for puffiness (swelling), redness, or leaking fluid. °· If surgery cuts were made in your legs: °· Avoid crossing your legs. °· Avoid sitting for long periods of time. Change positions every 30 minutes. °· Raise your legs  when you are sitting. Place them on pillows. °· Wear stockings that help keep blood clots from forming in your legs (compression stockings). °· Only take sponge baths until your doctor says it is okay to take showers. Pat the surgery area dry. Do not rub the surgery area with a washcloth or towel. Do not bathe, swim, or use a hot tub until your doctor says it is okay. °· Eat foods that are high in fiber. These include raw fruits and vegetables, whole grains, beans, and nuts. Choose lean meats. Avoid canned, processed, and fried foods. °· Drink enough fluids to keep your pee (urine) clear or pale yellow. °· Weigh yourself every day. °· Rest and limit activity as told by your doctor. You may be told to: °· Stop any activity if you have chest pain, shortness of breath, changes in heartbeat, or dizziness. Get help right away if this happens. °· Move around often for short amounts of time or take short walks as told by your doctor. Gradually become more active. You may need help to strengthen your muscles and build endurance. °· Avoid lifting, pushing, or pulling anything heavier than 10 pounds (4.5 kg) for at least 6 weeks after surgery. °· Do not drive until your doctor says it is okay. °· Ask your doctor when you can go back to work. °· Ask your doctor when you can begin sexual activity again. °· Follow up with your doctor as told. °GET HELP IF: °· You have puffiness, redness, more pain, or fluid draining from the incision site. °· You have a fever. °· You have puffiness in your ankles or legs. °· You have pain in your legs. °· You gain 2 or more pounds (0.9 kg) a day. °· You feel sick to your stomach (nauseous) or throw up (vomit). °· You have watery poop (diarrhea). °GET HELP RIGHT AWAY IF: °· You have chest pain that goes to your jaw or arms. °· You have shortness of breath. °· You have a fast or irregular heartbeat. °· You notice a "clicking" in your breastbone when you move. °· You have numbness or weakness in  your arms or legs. °· You feel dizzy or light-headed. °MAKE SURE YOU: °· Understand these instructions. °· Will watch your condition. °· Will get help right away if you are not doing well or get worse. °Document Released: 06/25/2013 Document Reviewed: 06/25/2013 °ExitCare® Patient Information ©2015 [employer]. This information is not intended to replace advice given to you by your health care provider. Make sure you discuss any questions you have with your health care provider. ° °

## 2015-03-06 NOTE — Progress Notes (Signed)
CARDIAC REHAB PHASE I   PRE:  Rate/Rhythm: 93 SR  BP:  Supine:   Sitting:   Standing:    SaO2: 88-89%RA     Going to bathroom  MODE:  Ambulation: 550 ft   POST:  Rate/Rhythm: 108 ST  BP:  Supine:   Sitting: 140/80  Standing:    SaO2: 90 -93% 2L 0940-1030 Pt walked 550 ft on 2L with rolling walker and minimal asst. Gait steady. Remained in NSR. To recliner after walk. Left on 2L. Encouraged IS. Education completed with pt and wife who voiced understanding. Wrote down how to view post op video. Discussed CRP 2 and referring to [place] program.   Graylon Good, RN BSN  03/06/2015 10:27 AM

## 2015-03-06 NOTE — Progress Notes (Signed)
Pt oxygen saturation decreased to 86% on RA. RN placed pt on 2L N/C. Oxygen sat now 94%. RN will cont to monitor.

## 2015-03-06 NOTE — Progress Notes (Addendum)
      [address]       [place] [address]             [phone]      3 Days Post-Op Procedure(s) (LRB): CORONARY ARTERY BYPASS GRAFTING (CABG), ON PUMP, TIMES THREE, USING LEFT INTERNAL MAMMARY ARTERY, RIGHT GREATER SAPHENOUS VEIN HARVESTED ENDOSCOPICALLY (N/A) TRANSESOPHAGEAL ECHOCARDIOGRAM (TEE) (N/A)   Subjective:  Joel Berger has no complaints.  He did have brief episode of A. Fib yesterday.  He states this occurred after a coughing episode.  + ambulation  No BM  Objective: Vital signs in last 24 hours: Temp:  [97.9 F (36.6 C)-98.9 F (37.2 C)] 98.9 F (37.2 C) (09/02 0307) Pulse Rate:  [74-105] 91 (09/02 0307) Cardiac Rhythm:  [-] Normal sinus rhythm (09/02 0754) Resp:  [18-34] 18 (09/02 0307) BP: (97-131)/(60-101) 119/63 mmHg (09/02 0307) SpO2:  [94 %-100 %] 94 % (09/02 0307) Weight:  [220 lb 3.2 oz (99.882 kg)] 220 lb 3.2 oz (99.882 kg) (09/02 0307)  Intake/Output from previous day: 09/01 0701 - 09/02 0700 In: 553 [P.O.:480; I.V.:23; IV Piggyback:50] Out: 925 [Urine:925]  General appearance: alert, cooperative and no distress Heart: regular rate and rhythm Lungs: diminished breath sounds bibasilar Abdomen: soft, non-tender; bowel sounds normal; no masses,  no organomegaly Extremities: edema trace Wound: clean and dry  Lab Results:  Recent Labs  03/05/15 0431 03/06/15 0406  WBC 8.4 8.2  HGB 10.4* 9.9*  HCT 30.9* 29.4*  PLT 160 199   BMET:  Recent Labs  03/05/15 0431 03/06/15 0406  NA 134* 131*  K 3.8 3.8  CL 100* 97*  CO2 28 27  GLUCOSE 95 154*  BUN 27* 27*  CREATININE 0.76 0.77  CALCIUM 7.8* 7.6*    PT/INR:  Recent Labs  03/03/15 1418  LABPROT 18.5*  INR 1.54*   ABG    Component Value Date/Time   PHART 7.381 03/03/2015 1716   HCO3 25.9* 03/03/2015 1716   TCO2 22 03/04/2015 1646   O2SAT 60.4 03/05/2015 0820   CBG (last 3)   Recent Labs  03/05/15 1727 03/05/15 2122 03/06/15 0620  GLUCAP 157* 151* 134*     Assessment/Plan: S/P Procedure(s) (LRB): CORONARY ARTERY BYPASS GRAFTING (CABG), ON PUMP, TIMES THREE, USING LEFT INTERNAL MAMMARY ARTERY, RIGHT GREATER SAPHENOUS VEIN HARVESTED ENDOSCOPICALLY (N/A) TRANSESOPHAGEAL ECHOCARDIOGRAM (TEE) (N/A)  1. CV- Brief A. Fib yesterday, maintaining NSR, borderline BP- continue Lopressor 2. Pulm- wean oxygen as tolerated, continue IS CXR + effusions/atelectasis 3. Renal- creatinine ok, remains hypervolemic- on IV Lasix 4. DM- cbgs controlled, continue insulin today, can transition to oral medications tomorrow 5. Dispo- patient stable, needs to diurese, encouraged use of IS, if no further A. Fib can d/c wires in AM   LOS: 5 days    Joel Berger, Joel Berger 03/06/2015  Continue iv lasix to promote diuresis NSR today prob DC home Sunday  patient examined and medical record reviewed,agree with above note. Tharon Aquas Trigt III 03/06/2015

## 2015-03-06 NOTE — Progress Notes (Signed)
   03/06/15 1447  Mobility  Activity Ambulate in hall  Level of Assistance Independent  Assistive Device Front wheel walker  Distance Ambulated (ft) 450 ft  Ambulation Response Tolerated well  Pt ambulated on 2L of O2. Tolerated well.

## 2015-03-06 NOTE — Progress Notes (Signed)
Utilization review completed.  

## 2015-03-06 NOTE — Discharge Summary (Signed)
Physician Discharge Summary  Patient ID: Joel Berger MRN: [id number] DOB/AGE: 07-24-1960 54 y.o.  Admit date: 03/01/2015 Discharge date: 03/08/2015  Admission Diagnoses: Severe CAD, STEMI (ST elevation myocardial infarction)-Inferior  Discharge Diagnoses:  Principal Problem:   STEMI (ST elevation myocardial infarction)-Inferior Active Problems:   Essential hypertension   Diabetes mellitus type 2 in nonobese   Non-ST elevation myocardial infarction (NSTEMI)   NSTEMI (non-ST elevated myocardial infarction)   S/P CABG x 3   Coronary artery disease involving native coronary artery of native heart with angina pectoris with documented spasm   Coronary artery disease involving native coronary artery of native heart without angina pectoris   Atrial fibrillation  Patient Active Problem List   Diagnosis Date Noted  . Atrial fibrillation 03/08/2015  . Coronary artery disease involving native coronary artery of native heart without angina pectoris   . Coronary artery disease involving native coronary artery of native heart with angina pectoris with documented spasm   . S/P CABG x 3 03/03/2015  . STEMI (ST elevation myocardial infarction)-Inferior 03/01/2015  . Essential hypertension 03/01/2015  . Diabetes mellitus type 2 in nonobese 03/01/2015  . Non-ST elevation myocardial infarction (NSTEMI) 03/01/2015  . NSTEMI (non-ST elevated myocardial infarction)    HPI: at time of admission The patient is a 54 year old male with a history of diabetes and hypertension. Patient's father had his first heart attack at age 17 and the subsequent one at  44. His brother had one at age 19. The patient reports on Friday he was mowing his grass and became nauseated. He was having a lot of gas and burping. This tapered off. However, yesterday around 4 PM he became nauseated and vomited he was having some slight chest discomfort. He woke at 1 AM with similar symptoms and that 0330 hrs. he developed more  chest pain and at 0430hrs it was 8 out of 10. He did feel a little bit sweaty but had no shortness of breath. Currently having 3 out of 10 chest pressure. He was felt to require admission for further evaluation and treatment as he ruled in for ST elevation myocardial infarction with initial troponin at greater than 20.   Discharged Condition: good  Hospital Course: The patient was admitted to the cardiology service placed on intravenous heparin and nitroglycerin. He was scheduled for emergent cardiac catheterization. He was found to have the following lesions and conclusions were made to obtain cardiothoracic surgical consultation:    Conclusion    1. Mid RCA to Dist RCA lesion, 75% stenosed. 2. 1st Mrg lesion, 40% stenosed. 3. Prox LAD to Mid LAD lesion, 90% stenosed. 4. 1st Diag lesion, 70% stenosed. 5. Post Atrio lesion, 100% stenosed.   Late presenting inferior ST elevation infarction, arriving 12 hours after symptom onset with initial troponin of 20. EKG revealing evolutionary inferior ST and T-wave abnormality.  Recurrent post-infarct pain, leading to coronary angiography and the following findings: Culprit total occlusion of the distal right coronary beyond the large dominant PDA (right dominant anatomy); distal right coronary with eccentric 50-80% stenosis (worse in RAO view); 90% prox- mid LAD forming a bifurcation Ladona Mow 0,0,1) with large diagonal #1, which also contains mid 60-80% stenosis; and luminal irregularities up to 40% in the obtuse marginal.  Inferobasal akinesis. EF 45-50%   RECOMMENDATIONS:   Heart team approach. Consider surgical revascularization with LIMA to LAD, and also grafting of the large diagonal and the PDA. An alternative approach would be percutaneous coronary intervention, however the LAD bifurcation lesion runs  a risk of future ischemic complications in the diagonal territory both acutely and long-term.   He was seen in consultation by Ivin Poot MD. he agreed with recommendations to proceed with coronary artery surgical revascularization. He was medically stabilized and on 03/03/2015 he underwent the following procedure:  DATE OF PROCEDURE: 03/03/2015 DATE OF DISCHARGE: 03/03/2015   OPERATIVE REPORT   OPERATION: 1. Coronary artery bypass grafting x3 (left internal mammary artery to  LAD, saphenous vein graft to first diagonal, saphenous vein graft  to posterior descending). 2. Endoscopic harvest of right leg greater saphenous vein.  SURGEON: Ivin Poot, M.D.  ASSISTANT: Ellwood Handler, PA-C  PREOPERATIVE DIAGNOSIS: Post infarction unstable angina with severe multivessel coronary artery disease, mild-moderate LV dysfunction.  POSTOPERATIVE DIAGNOSIS: Post infarction unstable angina with severe multivessel coronary artery disease, mild-moderate LV dysfunction.  ANESTHESIA: General by Dr. Lillia Abed. Total cardiopulmonary bypass time was 110 minutes. The patient returned to the ICU in stable condition  Post operative hospital course: Patient has overall progressed well. He initially did require some hemodynamic support with milrinone and dopamine. He was weaned from this without significant difficulty. He was weaned from the ventilator without difficulty. He has had some volume overload but has responded to diuretics. Diabetes has been under good control using standard protocols. All routine lines, monitors and drainage devices have been discontinued using routine protocols. He did have postoperative atrial fibrillation which reverted quickly to sinus rhythm but was placed on amiodarone. Currently he is felt to be tentatively stable for discharge in the next 24-48 hours pending ongoing reevaluation of his recovery.    Consults: None  Significant Diagnostic Studies: angiography: cardiac cath  Treatments: surgery: as described above  Discharge Exam: Blood pressure  123/70, pulse 83, temperature 98.3 F (36.8 C), temperature source Oral, resp. rate 16, height 6' (1.829 m), weight 216 lb 11.4 oz (98.3 kg), SpO2 94 %.   General appearance: alert, cooperative and no distress Heart: regular rate and rhythm Lungs: mildly dim in bases Abdomen: benign Extremities: edema conts to improve Wound: incis healing well  Disposition: Final discharge disposition not confirmed      Discharge Instructions    Amb Referral to Cardiac Rehabilitation    Complete by:  As directed   Congestive Heart Failure: If diagnosis is Heart Failure, patient MUST meet each of the CMS criteria: 1. Left Ventricular Ejection Fraction </= 35% 2. NYHA class II-IV symptoms despite being on optimal heart failure therapy for at least 6 weeks. 3. Stable = have not had a recent (<6 weeks) or planned (<6 months) major cardiovascular hospitalization or procedure  Program Details: - Physician supervised classes - 1-3 classes per week over a 12-18 week period, generally for a total of 36 sessions  Physician Certification: I certify that the above Cardiac Rehabilitation treatment is medically necessary and is medically approved by me for treatment of this patient. The patient is willing and cooperative, able to ambulate and medically stable to participate in exercise rehabilitation. The participant's progress and Individualized Treatment Plan will be reviewed by the Medical Director, Cardiac Rehab staff and as indicated by the Referring/Ordering Physician.  Diagnosis:  CABG          Medications at time of Discharge:    Medication List    TAKE these medications        ACCU-CHEK AVIVA PLUS test strip  Generic drug:  glucose blood     amiodarone 400 MG tablet  Commonly known as:  PACERONE  Take 1 tablet (400 mg total) by mouth 2 (two) times daily. For 7 days  Then take 400 mg daily     aspirin 325 MG EC tablet  Take 1 tablet (325 mg total) by mouth daily.     atorvastatin 20 MG  tablet  Commonly known as:  LIPITOR  Take 1 tablet (20 mg total) by mouth daily at 6 PM.     furosemide 40 MG tablet  Commonly known as:  LASIX  Take 1 tablet (40 mg total) by mouth daily.     gabapentin 600 MG tablet  Commonly known as:  NEURONTIN  Take 600 mg by mouth 4 (four) times daily - after meals and at bedtime.     glipiZIDE 10 MG tablet  Commonly known as:  GLUCOTROL  Take 10 mg by mouth Twice daily.     lisinopril 2.5 MG tablet  Commonly known as:  PRINIVIL,ZESTRIL  Take 2.5 mg by mouth Daily.     metFORMIN 1000 MG tablet  Commonly known as:  GLUCOPHAGE  Take 1,000 mg by mouth Twice daily.     metoprolol tartrate 25 MG tablet  Commonly known as:  LOPRESSOR  Take 0.5 tablets (12.5 mg total) by mouth 2 (two) times daily.     potassium chloride SA 20 MEQ tablet  Commonly known as:  K-DUR,KLOR-CON  Take 1 tablet (20 mEq total) by mouth daily.     rOPINIRole 0.5 MG tablet  Commonly known as:  REQUIP  Take 0.5 mg by mouth at bedtime.     traMADol 50 MG tablet  Commonly known as:  ULTRAM  Take 1-2 tablets (50-100 mg total) by mouth every 6 (six) hours as needed for moderate pain.        Follow-up Information    Follow up with Len Childs, MD.   Specialty:  Cardiothoracic Surgery   Why:  04/01/15 at 11:00 am to see the surgeon. Please obtain a chest xray at [facility] at 10:00 am. [facility] is located in the same office complex   Contact information:   [place] [address] [phone]       Follow up with Sueanne Margarita, MD.   Specialty:  Cardiology   Why:  Cardiology office-2 weeks- office should contact you in next couple days. If they haven't, please call to arrange   Contact information:   [address] [place] [address] [phone]         The patient has been discharged on:   1.Beta Blocker:  Yes [ y  ]                              No   [   ]                               If No, reason:  2.Ace Inhibitor/ARB: Yes [ y  ]                                     No  [   ]  If No, 3.Statin:   Yes Blue.Reese   ]                  No  [   ]                  If No, reason:  4.Ecasa:  Yes  [  y ]                  No   [   ]                  If No, reason:  Signed: Adilene Areola E 03/08/2015, 8:52 AM

## 2015-03-06 NOTE — Progress Notes (Signed)
Pt went into Aflutter with HR 80-100, asymptomatic with SPO2 of 96%.  HR briefly up to 120's while Pt went to bathroom.  Once in bed for EKG, converted back to NSR.  Pt remains in bed now with family in room, and no complaints.  Will con't to monitor closely.

## 2015-03-06 NOTE — Progress Notes (Signed)
Pt had a burst of Afib/A. Flutter. Pt asymptomatic resting in bed. Pt returned to NSR. RN will cont to monitor

## 2015-03-07 DIAGNOSIS — I2510 Atherosclerotic heart disease of native coronary artery without angina pectoris: Secondary | ICD-10-CM | POA: Insufficient documentation

## 2015-03-07 LAB — GLUCOSE, CAPILLARY
GLUCOSE-CAPILLARY: 123 mg/dL — AB (ref 65–99)
GLUCOSE-CAPILLARY: 139 mg/dL — AB (ref 65–99)
GLUCOSE-CAPILLARY: 153 mg/dL — AB (ref 65–99)
Glucose-Capillary: 82 mg/dL (ref 65–99)

## 2015-03-07 MED ORDER — ATORVASTATIN CALCIUM 20 MG PO TABS
20.0000 mg | ORAL_TABLET | Freq: Every day | ORAL | Status: DC
  Administered 2015-03-07: 20 mg via ORAL
  Filled 2015-03-07 (×2): qty 1

## 2015-03-07 MED ORDER — AMIODARONE HCL 200 MG PO TABS
400.0000 mg | ORAL_TABLET | Freq: Two times a day (BID) | ORAL | Status: DC
  Administered 2015-03-07 – 2015-03-08 (×3): 400 mg via ORAL
  Filled 2015-03-07 (×4): qty 2

## 2015-03-07 NOTE — Progress Notes (Signed)
Patient ambulated 400 feet unassisted. Patient tolerated well. Patient returned to bed and call bell with in reach.  RN will continue to monitor

## 2015-03-07 NOTE — Progress Notes (Signed)
Patient ambulated 546ft on room air independently.  Patient with no concerns of pain,SOB.  Patient returned to room in bed with call bell within reach.  RN will continue to monitor

## 2015-03-07 NOTE — Progress Notes (Addendum)
[address]       [place] [address]             [phone]      4 Days Post-Op Procedure(s) (LRB): CORONARY ARTERY BYPASS GRAFTING (CABG), ON PUMP, TIMES THREE, USING LEFT INTERNAL MAMMARY ARTERY, RIGHT GREATER SAPHENOUS VEIN HARVESTED ENDOSCOPICALLY (N/A) TRANSESOPHAGEAL ECHOCARDIOGRAM (TEE) (N/A) Subjective: Feeling better, some recurrent afib, currently sinus  Objective: Vital signs in last 24 hours: Temp:  [98 F (36.7 C)-99.1 F (37.3 C)] 99.1 F (37.3 C) (09/03 0522) Pulse Rate:  [86-104] 96 (09/03 0522) Cardiac Rhythm:  [-] Normal sinus rhythm (09/03 0800) Resp:  [16-20] 16 (09/03 0522) BP: (116-135)/(67-76) 117/69 mmHg (09/03 0522) SpO2:  [93 %-98 %] 95 % (09/03 0522) Weight:  [217 lb 2.5 oz (98.5 kg)] 217 lb 2.5 oz (98.5 kg) (09/03 0522)  Hemodynamic parameters for last 24 hours:    Intake/Output from previous day: 09/02 0701 - 09/03 0700 In: 1680 [P.O.:1680] Out: 900 [Urine:900] Intake/Output this shift:    General appearance: alert, cooperative and no distress Heart: regular rate and rhythm Lungs: fair air exchange, more dim in bases Abdomen: benign Extremities: min edema Wound: incis healing well  Lab Results:  Recent Labs  03/05/15 0431 03/06/15 0406  WBC 8.4 8.2  HGB 10.4* 9.9*  HCT 30.9* 29.4*  PLT 160 199   BMET:  Recent Labs  03/05/15 0431 03/06/15 0406  NA 134* 131*  K 3.8 3.8  CL 100* 97*  CO2 28 27  GLUCOSE 95 154*  BUN 27* 27*  CREATININE 0.76 0.77  CALCIUM 7.8* 7.6*    PT/INR: No results for input(s): LABPROT, INR in the last 72 hours. ABG    Component Value Date/Time   PHART 7.381 03/03/2015 1716   HCO3 25.9* 03/03/2015 1716   TCO2 22 03/04/2015 1646   O2SAT 60.4 03/05/2015 0820   CBG (last 3)   Recent Labs  03/06/15 1646 03/06/15 2110 03/07/15 0611  GLUCAP 147* 146* 82    Meds Scheduled Meds: . acetaminophen  1,000 mg Oral 4 times per day   Or  . acetaminophen (TYLENOL) oral  liquid 160 mg/5 mL  1,000 mg Per Tube 4 times per day  . antiseptic oral rinse  7 mL Mouth Rinse QID  . aspirin EC  325 mg Oral Daily   Or  . aspirin  324 mg Per Tube Daily  . bisacodyl  10 mg Oral Daily   Or  . bisacodyl  10 mg Rectal Daily  . Chlorhexidine Gluconate Cloth  6 each Topical Daily  . docusate sodium  200 mg Oral Daily  . furosemide  40 mg Intravenous BID  . gabapentin  600 mg Oral TID PC & HS  . insulin aspart  0-15 Units Subcutaneous TID WC  . insulin detemir  18 Units Subcutaneous BID  . metoprolol tartrate  12.5 mg Oral BID   Or  . metoprolol tartrate  12.5 mg Per Tube BID  . mupirocin ointment  1 application Nasal BID  . pantoprazole  40 mg Oral Daily  . potassium chloride  20 mEq Oral Daily  . simvastatin  40 mg Oral q1800  . sodium chloride  3 mL Intravenous Q12H  . sodium chloride  3 mL Intravenous Q12H   Continuous Infusions: . sodium chloride Stopped (03/04/15 0800)  . sodium chloride Stopped (03/03/15 1330)  . sodium chloride Stopped (03/04/15 0800)   PRN Meds:.sodium chloride, sodium chloride, lactulose, magnesium hydroxide,  metoprolol, ondansetron (ZOFRAN) IV, oxyCODONE, sodium chloride, sodium chloride, traMADol, zolpidem  Xrays Dg Chest 2 View  03/06/2015   CLINICAL DATA:  Status post CABG, no chest complaints this morning  EXAM: CHEST  2 VIEW  COMPARISON:  Portable chest x-ray of March 05, 2015  FINDINGS: The lungs are hypoinflated. There are bibasilar subsegmental atelectasis and small bilateral pleural effusions. The pulmonary interstitial markings are less prominent. The cardiac silhouette remains enlarged. There are 6 intact sternal wires. The right internal jugular Cordis sheath has been removed.  IMPRESSION: Bilateral hypoinflation. Improving pulmonary interstitial edema and bilateral subsegmental atelectasis. Small bilateral pleural effusions remain.   Electronically Signed   By: David  Martinique M.D.   On: 03/06/2015 08:06     Assessment/Plan: S/P Procedure(s) (LRB): CORONARY ARTERY BYPASS GRAFTING (CABG), ON PUMP, TIMES THREE, USING LEFT INTERNAL MAMMARY ARTERY, RIGHT GREATER SAPHENOUS VEIN HARVESTED ENDOSCOPICALLY (N/A) TRANSESOPHAGEAL ECHOCARDIOGRAM (TEE) (N/A)  1 doing well 2 will add po amiodarone po 3 cont pulm toilet/rehab 4 cont IV lasix for now but change to po soon, pulm edema/effus on exam    LOS: 6 days    GOLD,WAYNE E 03/07/2015  Patient seen and examined, agree with above In SR currently  Karleigh Bunte C. Roxan Hockey, MD [facility] [phone]

## 2015-03-07 NOTE — Progress Notes (Signed)
Patient ambulated 300 ft on room air independently.  Patient returned to room with call bell within reach.  RN will continue to monitor

## 2015-03-07 NOTE — Progress Notes (Signed)
CARDIAC REHAB PHASE I   PRE:  Rate/Rhythm: 95 sinus rhythm  BP:  Supine:   Sitting: 140/80  Standing:    SaO2: 88% ra up to 91% with PLB  MODE:  Ambulation: 890 ft   POST:  Rate/Rhythem: 110 sinus rhythm  BP:  Supine:   Sitting: 150/68  Standing:    SaO2: 88% ra up to 95% with PLB  0935-1005  Pt ambulated in hallway independently steady gait.  Pt has been walking on his own several times this morning.  O2 sat -88% easily increases with PLB.  Pt encouraged to practive PLB, good pulmonary toilet especially cough, deep breath and expelling secretions.  Pt also encouraged to increase IS use.  Understanding verbalized. Pt returned to bed independently, call light in reach. Pt has been sitting in chair several hours this morning.    [place], [place]

## 2015-03-08 DIAGNOSIS — I4891 Unspecified atrial fibrillation: Secondary | ICD-10-CM | POA: Diagnosis not present

## 2015-03-08 DIAGNOSIS — I9789 Other postprocedural complications and disorders of the circulatory system, not elsewhere classified: Secondary | ICD-10-CM

## 2015-03-08 LAB — GLUCOSE, CAPILLARY
Glucose-Capillary: 68 mg/dL (ref 65–99)
Glucose-Capillary: 178 mg/dL — ABNORMAL HIGH (ref 65–99)

## 2015-03-08 MED ORDER — ATORVASTATIN CALCIUM 20 MG PO TABS: 20.0000 mg | ORAL_TABLET | Freq: Every day | ORAL | Status: DC

## 2015-03-08 MED ORDER — ASPIRIN 325 MG PO TBEC: 325.0000 mg | DELAYED_RELEASE_TABLET | Freq: Every day | ORAL | Status: DC

## 2015-03-08 MED ORDER — FUROSEMIDE 40 MG PO TABS: 40.0000 mg | ORAL_TABLET | Freq: Every day | ORAL | Status: DC

## 2015-03-08 MED ORDER — POTASSIUM CHLORIDE CRYS ER 20 MEQ PO TBCR: 20.0000 meq | EXTENDED_RELEASE_TABLET | Freq: Every day | ORAL | Status: DC

## 2015-03-08 MED ORDER — METOPROLOL TARTRATE 25 MG PO TABS: 12.5000 mg | ORAL_TABLET | Freq: Two times a day (BID) | ORAL | Status: DC

## 2015-03-08 MED ORDER — TRAMADOL HCL 50 MG PO TABS: 50.0000 mg | ORAL_TABLET | Freq: Four times a day (QID) | ORAL | Status: DC | PRN

## 2015-03-08 MED ORDER — AMIODARONE HCL 400 MG PO TABS: 400.0000 mg | ORAL_TABLET | Freq: Two times a day (BID) | ORAL | Status: DC

## 2015-03-08 NOTE — Progress Notes (Addendum)
[address]       [place] [address]             [phone]      5 Days Post-Op Procedure(s) (LRB): CORONARY ARTERY BYPASS GRAFTING (CABG), ON PUMP, TIMES THREE, USING LEFT INTERNAL MAMMARY ARTERY, RIGHT GREATER SAPHENOUS VEIN HARVESTED ENDOSCOPICALLY (N/A) TRANSESOPHAGEAL ECHOCARDIOGRAM (TEE) (N/A) Subjective: Feeling much better today  Objective: Vital signs in last 24 hours: Temp:  [97.8 F (36.6 C)-98.7 F (37.1 C)] 98.3 F (36.8 C) (09/04 0501) Pulse Rate:  [83-94] 83 (09/04 0501) Cardiac Rhythm:  [-] Normal sinus rhythm (09/03 1943) Resp:  [16] 16 (09/04 0501) BP: (112-140)/(60-76) 123/70 mmHg (09/04 0501) SpO2:  [94 %-98 %] 94 % (09/04 0501) Weight:  [216 lb 11.4 oz (98.3 kg)] 216 lb 11.4 oz (98.3 kg) (09/04 0501)  Hemodynamic parameters for last 24 hours:    Intake/Output from previous day: 09/03 0701 - 09/04 0700 In: 720 [P.O.:720] Out: 600 [Urine:600] Intake/Output this shift:    General appearance: alert, cooperative and no distress Heart: regular rate and rhythm Lungs: mildly dim in bases Abdomen: benign Extremities: edema conts to improve Wound: incis healing well  Lab Results:  Recent Labs  03/06/15 0406  WBC 8.2  HGB 9.9*  HCT 29.4*  PLT 199   BMET:  Recent Labs  03/06/15 0406  NA 131*  K 3.8  CL 97*  CO2 27  GLUCOSE 154*  BUN 27*  CREATININE 0.77  CALCIUM 7.6*    PT/INR: No results for input(s): LABPROT, INR in the last 72 hours. ABG    Component Value Date/Time   PHART 7.381 03/03/2015 1716   HCO3 25.9* 03/03/2015 1716   TCO2 22 03/04/2015 1646   O2SAT 60.4 03/05/2015 0820   CBG (last 3)   Recent Labs  03/07/15 1642 03/07/15 2129 03/08/15 0617  GLUCAP 139* 153* 68    Meds Scheduled Meds: . acetaminophen  1,000 mg Oral 4 times per day   Or  . acetaminophen (TYLENOL) oral liquid 160 mg/5 mL  1,000 mg Per Tube 4 times per day  . amiodarone  400 mg Oral BID  . antiseptic oral rinse  7 mL  Mouth Rinse QID  . aspirin EC  325 mg Oral Daily   Or  . aspirin  324 mg Per Tube Daily  . atorvastatin  20 mg Oral q1800  . bisacodyl  10 mg Oral Daily   Or  . bisacodyl  10 mg Rectal Daily  . Chlorhexidine Gluconate Cloth  6 each Topical Daily  . docusate sodium  200 mg Oral Daily  . furosemide  40 mg Intravenous BID  . gabapentin  600 mg Oral TID PC & HS  . insulin aspart  0-15 Units Subcutaneous TID WC  . insulin detemir  18 Units Subcutaneous BID  . metoprolol tartrate  12.5 mg Oral BID   Or  . metoprolol tartrate  12.5 mg Per Tube BID  . mupirocin ointment  1 application Nasal BID  . pantoprazole  40 mg Oral Daily  . potassium chloride  20 mEq Oral Daily  . sodium chloride  3 mL Intravenous Q12H  . sodium chloride  3 mL Intravenous Q12H   Continuous Infusions: . sodium chloride Stopped (03/04/15 0800)  . sodium chloride Stopped (03/03/15 1330)  . sodium chloride Stopped (03/04/15 0800)   PRN Meds:.sodium chloride, sodium chloride, lactulose, magnesium hydroxide, metoprolol, ondansetron (ZOFRAN) IV, oxyCODONE, sodium chloride, sodium chloride, traMADol, zolpidem  Xrays No results found.  Assessment/Plan: S/P Procedure(s) (LRB): CORONARY ARTERY BYPASS GRAFTING (CABG), ON PUMP, TIMES THREE, USING LEFT INTERNAL MAMMARY ARTERY, RIGHT GREATER SAPHENOUS VEIN HARVESTED ENDOSCOPICALLY (N/A) TRANSESOPHAGEAL ECHOCARDIOGRAM (TEE) (N/A) Plan for discharge: see discharge orders   LOS: 7 days    GOLD,WAYNE E 03/08/2015  Patient seen and examined, agree with above Home today  [place] C. Roxan Hockey, MD [facility] [phone]

## 2015-03-08 NOTE — Progress Notes (Signed)
03/08/2015 1330 Discharge AVS meds taken today and those due this evening reviewed.  Follow-up appointments and when to call md reviewed.  D/C IV and TELE.  Questions and concerns addressed.   D/C home per orders. [place]

## 2015-03-08 NOTE — Progress Notes (Signed)
03/08/2015 9:19 AM EPW D/C'd per order and per protocol.  Ends intact. Pt. Tolerated well.  Advised bedrest x1hr.  Call bell in reach.  Vital signs collected per protocol. [place]

## 2015-03-16 ENCOUNTER — Telehealth: Payer: Self-pay | Admitting: *Deleted

## 2015-03-16 NOTE — Telephone Encounter (Signed)
Patient called and stated that he was still having some swelling and would like to know if we could send in another seven day supply of the furosemide and potassium. Ok to refill? Also, he asked about an rx for his test strips. I informed him that this would need to be handled by his pcp or the physician that manages his diabetes. He stated that his physician was in [facility] and he did not want to drive forty minutes just to be seen to have his test strips refilled. He will call their office to see if he can get the test strips refilled.

## 2015-03-16 NOTE — Telephone Encounter (Signed)
He has an appt on 19th with our extender.  Please see if he can be worked in Architectural technologist with extender

## 2015-03-17 ENCOUNTER — Encounter: Payer: Self-pay | Admitting: Physician Assistant

## 2015-03-17 ENCOUNTER — Ambulatory Visit (INDEPENDENT_AMBULATORY_CARE_PROVIDER_SITE_OTHER): Payer: 59 | Admitting: Physician Assistant

## 2015-03-17 ENCOUNTER — Other Ambulatory Visit: Payer: Self-pay | Admitting: Physician Assistant

## 2015-03-17 VITALS — BP 130/60 | HR 73 | Ht 72.0 in | Wt 207.8 lb

## 2015-03-17 DIAGNOSIS — I2510 Atherosclerotic heart disease of native coronary artery without angina pectoris: Secondary | ICD-10-CM | POA: Diagnosis not present

## 2015-03-17 DIAGNOSIS — E785 Hyperlipidemia, unspecified: Secondary | ICD-10-CM | POA: Diagnosis not present

## 2015-03-17 DIAGNOSIS — I10 Essential (primary) hypertension: Secondary | ICD-10-CM

## 2015-03-17 DIAGNOSIS — I5032 Chronic diastolic (congestive) heart failure: Secondary | ICD-10-CM | POA: Diagnosis not present

## 2015-03-17 DIAGNOSIS — I480 Paroxysmal atrial fibrillation: Secondary | ICD-10-CM

## 2015-03-17 LAB — BASIC METABOLIC PANEL
BUN: 12 mg/dL (ref 6–23)
CALCIUM: 9 mg/dL (ref 8.4–10.5)
CO2: 31 meq/L (ref 19–32)
CREATININE: 0.78 mg/dL (ref 0.40–1.50)
Chloride: 100 mEq/L (ref 96–112)
GFR: 110.12 mL/min (ref >60.00)
GLUCOSE: 121 mg/dL — AB (ref 70–99)
Potassium: 3.9 mEq/L (ref 3.5–5.1)
Sodium: 137 mEq/L (ref 135–145)

## 2015-03-17 MED ORDER — PRAVASTATIN SODIUM 20 MG PO TABS: 20.0000 mg | ORAL_TABLET | ORAL | Status: DC

## 2015-03-17 MED ORDER — FUROSEMIDE 40 MG PO TABS: 40.0000 mg | ORAL_TABLET | Freq: Every day | ORAL | Status: DC | PRN

## 2015-03-17 MED ORDER — POTASSIUM CHLORIDE CRYS ER 20 MEQ PO TBCR: 20.0000 meq | EXTENDED_RELEASE_TABLET | Freq: Every day | ORAL | Status: DC

## 2015-03-17 NOTE — Telephone Encounter (Signed)
Left message to call back  

## 2015-03-17 NOTE — Progress Notes (Signed)
Cardiology Office Note   Date:  03/17/2015   ID:  Joel Berger, DOB Nov 10, 1960, MRN [id number]  PCP:  Christ Kick, MD  Cardiologist:  Dr. Fransico Him   Electrophysiologist:  n/a  Chief Complaint  Patient presents with  . Hospitalization Follow-up    s/p Inf STEMI >> CABG  . Coronary Artery Disease     History of Present Illness: Joel Berger is a 54 y.o. male with a hx of DM2, HTN, FHx of CAD.  Admitted 8/28-9/4 with late presenting inferior STEMI.  LHC demonstrated an occluded distal RCA, 90% proximal LAD and 70% D1 stenosis.  EF was 45-50%.  He was seen by Dr. Prescott Gum and underwent CABG with L-LAD, S-D1, S-PDA.  Post op course was notable for AFib but converted to NSR.  He was placed on Amiodarone.  He did require diuresis for volume excess. Echo did demonstrate EF 50-55%.   He returns for follow-up.  Here today with his wife. Since DC from the hospital, he has been doing well. He states that he is feeling better than he did before he went to the hospital. Chest soreness is improved. He denies significant dyspnea. He denies orthopnea, PND. He does continue to note RLE edema. This is basically unchanged since he left the hospital. Vein harvesting was performed on the right LE. Weights have decreased since discharge. He denies fevers, vomiting, diarrhea, melena, hematochezia. He has had a mild cough that is improved.  He denies syncope.  He is walking daily without any problems.    Studies/Reports Reviewed Today:  LHC 03/01/15 LAD:  prox to mid 90%, D1 70% LCx:  OM1 40% RCA:  Mid to dist 75%, R post AV branch 100% EF 45-50%, inf AK   Late presenting inferior ST elevation infarction, arriving 12 hours after symptom onset with initial troponin of 20. EKG revealing evolutionary inferior ST and T-wave abnormality.  Recurrent post-infarct pain, leading to coronary angiography and the following findings: Culprit total occlusion of the distal right coronary beyond the  large dominant PDA (right dominant anatomy); distal right coronary with eccentric 50-80% stenosis (worse in RAO view); 90% prox- mid LAD forming a bifurcation Ladona Mow 0,0,1) with large diagonal #1, which also contains mid 60-80% stenosis; and luminal irregularities up to 40% in the obtuse marginal.  Inferobasal akinesis. EF 45-50%   RECOMMENDATIONS:   Heart team approach. Consider surgical revascularization with LIMA to LAD, and also grafting of the large diagonal and the PDA. An alternative approach would be percutaneous coronary intervention, however the LAD bifurcation lesion runs a risk of future ischemic complications in the diagonal territory both acutely and long-term.   Echo 03/02/15 EF 50-55%, inf AK, mild MR  Carotid US 03/02/15 Bilateral ICA 1-39%    Past Medical History  Diagnosis Date  . Diabetes mellitus   . Hypertension   . CAD (coronary artery disease)     a. late presenting Inf MI >> LHC with 3v CAD >> s/p CABG  . History of echocardiogram     a. Echo 8/16:  EF 50-55%, inf AK, mild MR  . Carotid stenosis     a. Carotid US 8/16:  bilat ICA 1-39%  . Dyslipidemia   . Atrial fibrillation     a. post CABG AF >> converted to NSR with Amiodarone    Past Surgical History  Procedure Laterality Date  . Cardiac catheterization N/A 03/01/2015    Procedure: Left Heart Cath and Coronary Angiography;  Surgeon: Mallie Mussel  Nicholes Stairs, MD;  Location: [facility];  Service: Cardiovascular;  Laterality: N/A;  . Coronary artery bypass graft N/A 03/03/2015    Procedure: CORONARY ARTERY BYPASS GRAFTING (CABG), ON PUMP, TIMES THREE, USING LEFT INTERNAL MAMMARY ARTERY, RIGHT GREATER SAPHENOUS VEIN HARVESTED ENDOSCOPICALLY;  Surgeon: Ivin Poot, MD;  Location: [facility];  Service: Open Heart Surgery;  Laterality: N/A;  LIMA TO LAD, SVG TO DIAGONAL, SVG TO PDA  . Tee without cardioversion N/A 03/03/2015    Procedure: TRANSESOPHAGEAL ECHOCARDIOGRAM (TEE);  Surgeon: Ivin Poot, MD;   Location: [facility];  Service: Open Heart Surgery;  Laterality: N/A;     Current Outpatient Prescriptions  Medication Sig Dispense Refill  . ACCU-CHEK AVIVA PLUS test strip     . amiodarone (PACERONE) 400 MG tablet Take 1 tablet (400 mg total) by mouth 2 (two) times daily. For 7 days  Then take 400 mg daily 70 tablet 1  . aspirin EC 325 MG EC tablet Take 1 tablet (325 mg total) by mouth daily.    Marland Kitchen atorvastatin (LIPITOR) 20 MG tablet Take 1 tablet (20 mg total) by mouth daily at 6 PM. 30 tablet 1  . furosemide (LASIX) 40 MG tablet Take 1 tablet (40 mg total) by mouth daily as needed. 30 tablet 2  . gabapentin (NEURONTIN) 600 MG tablet Take 600 mg by mouth 4 (four) times daily - after meals and at bedtime.    Marland Kitchen glipiZIDE (GLUCOTROL) 10 MG tablet Take 10 mg by mouth Twice daily.     Marland Kitchen lisinopril (PRINIVIL,ZESTRIL) 2.5 MG tablet Take 2.5 mg by mouth Daily.     . metFORMIN (GLUCOPHAGE) 1000 MG tablet Take 1,000 mg by mouth Twice daily.     . metoprolol tartrate (LOPRESSOR) 25 MG tablet Take 0.5 tablets (12.5 mg total) by mouth 2 (two) times daily. 31 tablet 1  . potassium chloride SA (K-DUR,KLOR-CON) 20 MEQ tablet Take 1 tablet (20 mEq total) by mouth daily. 30 tablet 2  . rOPINIRole (REQUIP) 0.5 MG tablet Take 0.5 mg by mouth at bedtime.    . traMADol (ULTRAM) 50 MG tablet Take 1-2 tablets (50-100 mg total) by mouth every 6 (six) hours as needed for moderate pain. 50 tablet 0  . pravastatin (PRAVACHOL) 20 MG tablet Take 1 tablet (20 mg total) by mouth every Monday, Wednesday, and Friday. 15 tablet 5   No current facility-administered medications for this visit.    Allergies:   Codeine and Phenergan    Social History:  The patient  reports that he has never smoked. He has never used smokeless tobacco. He reports that he drinks alcohol. He reports that he does not use illicit drugs.   Family History:  The patient's family history includes Colon polyps in his paternal uncle; Heart attack in his  brother and father; Heart failure in his mother. There is no history of Colon cancer.    ROS:   Please see the history of present illness.   Review of Systems  Cardiovascular: Positive for leg swelling.  Respiratory: Positive for cough.   All other systems reviewed and are negative.     PHYSICAL EXAM: VS:  BP 130/60 mmHg  Pulse 73  Ht 6' (1.829 m)  Wt 207 lb 12.8 oz (94.257 kg)  BMI 28.18 kg/m2    Wt Readings from Last 3 Encounters:  03/17/15 207 lb 12.8 oz (94.257 kg)  03/08/15 216 lb 11.4 oz (98.3 kg)  12/26/11 185 lb (83.915 kg)  GEN: Well nourished, well developed, in no acute distress HEENT: normal Neck: no JVD,  no masses Cardiac:  Normal D3/O6, RRR; 1/6 systolic  Murmur LLSB ,  no rubs or gallops, trace-1+ right ankle edema, trace LLE edema Chest: Median sternotomy well healed without erythema or discharge   Respiratory:  clear to auscultation bilaterally, no wheezing, rhonchi or rales. GI: soft, nontender, nondistended, + BS MS: no deformity or atrophy Skin: warm and dry  Neuro:  CNs II-XII intact, Strength and sensation are intact Psych: Normal affect   EKG:  EKG is ordered today.  It demonstrates:   NSR, HR 73, normal axis, T-wave inversions in 2, 3, aVF, V4-V6    Recent Labs: 03/01/2015: TSH 0.678 03/02/2015: ALT 30 03/04/2015: Magnesium 2.5* 03/06/2015: BUN 27*; Creatinine, Ser 0.77; Hemoglobin 9.9*; Platelets 199; Potassium 3.8; Sodium 131*    Lipid Panel    Component Value Date/Time   CHOL 145 03/02/2015 0119   TRIG 82 03/02/2015 0119   HDL 44 03/02/2015 0119   CHOLHDL 3.3 03/02/2015 0119   VLDL 16 03/02/2015 0119   LDLCALC 85 03/02/2015 0119      ASSESSMENT AND PLAN:   CAD:  S/p late presenting inferior STEMI. LHC demonstrated 3 v CAD and he is now s/p CABG. EF is preserved.  He is progressing well and denies significant dyspnea.  He will start Cardiac Rehab once he has seen Dr. Prescott Gum.  Continue ASA, beta-blocker, ACE inhibitor.  He has  been intol to multiple statins (Atorvastatin, Rosuvastatin).  I will place him on Pravastatin 20 mg QHS on every Mon, Wed, Fri.    Paroxysmal Atrial Fibrillation:  Patient had post op AFib.  He is maintaining NSR.  Continue Amiodarone for now.  I will have him reduce the dose to 200 mg QD in 1 week.  This will likely be DC'd at his next FU office visit.  Diastolic CHF:  He still has some volume overload post CABG.  I will have him take an extra week of Lasix 40 mg QD and K+ 20 mEq QD.  He can then take Lasix and K+ prn swelling. Check BMET today.  HTN:  Controlled. Continue current Rx.  Dyslipidemia:  As noted, I will try him on Pravastatin 20 mg every MWF.  If he can not tolerate this, try Zetia vs refer to [facility].  Check Lipids and LFTs in 6 weeks.       Medication Changes: Current medicines are reviewed at length with the patient today.  Concerns regarding medicines are as outlined above.  The following changes have been made:   Discontinued Medications   No medications on file   Modified Medications   Modified Medication Previous Medication   FUROSEMIDE (LASIX) 40 MG TABLET furosemide (LASIX) 40 MG tablet      Take 1 tablet (40 mg total) by mouth daily as needed.    Take 1 tablet (40 mg total) by mouth daily.   POTASSIUM CHLORIDE SA (K-DUR,KLOR-CON) 20 MEQ TABLET potassium chloride SA (K-DUR,KLOR-CON) 20 MEQ tablet      Take 1 tablet (20 mEq total) by mouth daily.    Take 1 tablet (20 mEq total) by mouth daily.   New Prescriptions   PRAVASTATIN (PRAVACHOL) 20 MG TABLET    Take 1 tablet (20 mg total) by mouth every Monday, Wednesday, and Friday.    Labs/ tests ordered today include:   Orders Placed This Encounter  Procedures  . Basic metabolic panel  . Lipid panel  .  Hepatic function panel  . EKG 12-Lead      Disposition:    FU with Dr. Fransico Him 6-8 weeks.    Signed, Versie Starks, MHS 03/17/2015 4:27 PM    [facility] [place], [place],   [address] Phone: [phone]; Fax: [phone]

## 2015-03-17 NOTE — Telephone Encounter (Signed)
Confirmed with patient his appointment today at 1500 with Richardson Dopp.

## 2015-03-17 NOTE — Telephone Encounter (Signed)
Offered patient OV today at 1500. Patient has not been cleared to drive. Instructed patient to try to find a ride and that he would be called in an hour to confirm or cancel appointment. Patient agrees with treatment plan.

## 2015-03-17 NOTE — Patient Instructions (Addendum)
Medication Instructions:  Your physician has recommended you make the following change in your medication:  1)CONTINUE Amiodarone 400mg  daily for 1 week. THEN take 20mg  daily 2)START Pravastatin 20mg  at bedtime every Mon,Wed,Fri. 3)TAKE Lasix 40mg  and Potassim 20 mEq daily for 7 days. Then take as needed for swelling   Labwork: Bmet Today  Your physician recommends that you return for a FASTING lipid profile and lft in 6-8 weeks   Testing/Procedures: None ordered  Follow-Up: Your physician recommends that you schedule a follow-up appointment in: 6-8 weeks with Dr.Turner   Any Other Special Instructions Will Be Listed Below (If Applicable).

## 2015-03-17 NOTE — Telephone Encounter (Signed)
See office note from today. Richardson Dopp, PA-C   03/17/2015 10:24 PM

## 2015-03-18 ENCOUNTER — Other Ambulatory Visit: Payer: Self-pay

## 2015-03-18 ENCOUNTER — Other Ambulatory Visit: Payer: Self-pay | Admitting: *Deleted

## 2015-03-18 DIAGNOSIS — E785 Hyperlipidemia, unspecified: Secondary | ICD-10-CM

## 2015-03-18 MED ORDER — PRAVASTATIN SODIUM 20 MG PO TABS: 20.0000 mg | ORAL_TABLET | ORAL | Status: DC

## 2015-03-18 MED ORDER — POTASSIUM CHLORIDE CRYS ER 20 MEQ PO TBCR: 20.0000 meq | EXTENDED_RELEASE_TABLET | Freq: Every day | ORAL | Status: DC

## 2015-03-18 MED ORDER — FUROSEMIDE 40 MG PO TABS: 40.0000 mg | ORAL_TABLET | Freq: Every day | ORAL | Status: DC | PRN

## 2015-03-19 ENCOUNTER — Telehealth: Payer: Self-pay | Admitting: Physician Assistant

## 2015-03-19 NOTE — Telephone Encounter (Signed)
Pt has been notified of lab results today by phone with verbal understanding.

## 2015-03-19 NOTE — Telephone Encounter (Signed)
New message  ° ° ° °Pt returning call about lab results  °

## 2015-03-19 NOTE — Telephone Encounter (Signed)
-----   Message from Liliane Shi, [place] sent at 03/18/2015  5:41 PM EDT ----- Potassium and kidney function ok Continue with current treatment plan. Richardson Dopp, PA-C   03/18/2015 5:41 PM

## 2015-03-23 ENCOUNTER — Ambulatory Visit: Payer: 59 | Admitting: Cardiology

## 2015-03-30 ENCOUNTER — Other Ambulatory Visit: Payer: Self-pay | Admitting: Cardiothoracic Surgery

## 2015-03-30 DIAGNOSIS — Z951 Presence of aortocoronary bypass graft: Secondary | ICD-10-CM

## 2015-04-01 ENCOUNTER — Encounter: Payer: Self-pay | Admitting: Cardiothoracic Surgery

## 2015-04-01 ENCOUNTER — Ambulatory Visit
Admission: RE | Admit: 2015-04-01 | Discharge: 2015-04-01 | Disposition: A | Discharge status: Home / Self Care | Payer: 59 | Source: Ambulatory Visit | Attending: Cardiothoracic Surgery | Admitting: Cardiothoracic Surgery

## 2015-04-01 ENCOUNTER — Ambulatory Visit (INDEPENDENT_AMBULATORY_CARE_PROVIDER_SITE_OTHER): Payer: Self-pay | Admitting: Cardiothoracic Surgery

## 2015-04-01 VITALS — BP 115/70 | HR 67 | Resp 20 | Ht 72.0 in | Wt 207.0 lb

## 2015-04-01 DIAGNOSIS — Z951 Presence of aortocoronary bypass graft: Secondary | ICD-10-CM

## 2015-04-01 NOTE — Progress Notes (Signed)
PCP is ACHREJA, Dickey Gave, MD Referring Provider is Sueanne Margarita, MD  Chief Complaint  Patient presents with  . Routine Post Op    f/u after surgery with CXR, s/p Coronary artery bypass grafting x3     HPI: Routine one month followup after urgent CABG x3 History diabetes Transient postop atrial fibrillation treated successfully with amiodarone currently on 200 mg daily Postop mild left pleural effusion, improving without thoracentesis Intolerance to statins-both Lipitor and pravastatin have been tried  Patient feels well and is walking one to 3 miles a day without recurrent angina. Surgical incisions are healing well. Chest x-rays clear except for small left pleural effusion No signs or symptoms of sternal instability The patient is scheduled start phase II cardiac rehabilitation next week  Past Medical History  Diagnosis Date  . Diabetes mellitus   . Hypertension   . CAD (coronary artery disease)     a. late presenting Inf MI >> LHC with 3v CAD >> s/p CABG  . History of echocardiogram     a. Echo 8/16:  EF 50-55%, inf AK, mild MR  . Carotid stenosis     a. Carotid US 8/16:  bilat ICA 1-39%  . Dyslipidemia   . Atrial fibrillation     a. post CABG AF >> converted to NSR with Amiodarone    Past Surgical History  Procedure Laterality Date  . Cardiac catheterization N/A 03/01/2015    Procedure: Left Heart Cath and Coronary Angiography;  Surgeon: Belva Crome, MD;  Location: [facility];  Service: Cardiovascular;  Laterality: N/A;  . Coronary artery bypass graft N/A 03/03/2015    Procedure: CORONARY ARTERY BYPASS GRAFTING (CABG), ON PUMP, TIMES THREE, USING LEFT INTERNAL MAMMARY ARTERY, RIGHT GREATER SAPHENOUS VEIN HARVESTED ENDOSCOPICALLY;  Surgeon: Ivin Poot, MD;  Location: [facility];  Service: Open Heart Surgery;  Laterality: N/A;  LIMA TO LAD, SVG TO DIAGONAL, SVG TO PDA  . Tee without cardioversion N/A 03/03/2015    Procedure: TRANSESOPHAGEAL ECHOCARDIOGRAM  (TEE);  Surgeon: Ivin Poot, MD;  Location: [facility];  Service: Open Heart Surgery;  Laterality: N/A;    Family History  Problem Relation Age of Onset  . Colon polyps Paternal Uncle   . Colon cancer Neg Hx   . Heart failure Mother   . Heart attack Father   . Heart attack Brother     Social History Social History  Substance Use Topics  . Smoking status: Never Smoker   . Smokeless tobacco: Never Used  . Alcohol Use: Yes     Comment: OCCASIONAL BEER,ONCE WEEK    Current Outpatient Prescriptions  Medication Sig Dispense Refill  . ACCU-CHEK AVIVA PLUS test strip     . amiodarone (PACERONE) 400 MG tablet Take 1 tablet (400 mg total) by mouth 2 (two) times daily. For 7 days  Then take 400 mg daily (Patient taking differently: Take 200 mg by mouth daily. ) 70 tablet 1  . aspirin EC 325 MG EC tablet Take 1 tablet (325 mg total) by mouth daily.    Marland Kitchen atorvastatin (LIPITOR) 20 MG tablet Take 1 tablet (20 mg total) by mouth daily at 6 PM. 30 tablet 1  . gabapentin (NEURONTIN) 600 MG tablet Take 600 mg by mouth 4 (four) times daily - after meals and at bedtime.    Marland Kitchen glipiZIDE (GLUCOTROL) 10 MG tablet Take 10 mg by mouth Twice daily.     Marland Kitchen lisinopril (PRINIVIL,ZESTRIL) 2.5 MG tablet Take 2.5 mg by  mouth Daily.     . metFORMIN (GLUCOPHAGE) 1000 MG tablet Take 1,000 mg by mouth Twice daily.     . metoprolol tartrate (LOPRESSOR) 25 MG tablet Take 0.5 tablets (12.5 mg total) by mouth 2 (two) times daily. 31 tablet 1  . pravastatin (PRAVACHOL) 20 MG tablet Take 1 tablet (20 mg total) by mouth every Monday, Wednesday, and Friday. 90 tablet 0  . rOPINIRole (REQUIP) 0.5 MG tablet Take 0.5 mg by mouth at bedtime.    . traMADol (ULTRAM) 50 MG tablet Take 1-2 tablets (50-100 mg total) by mouth every 6 (six) hours as needed for moderate pain. 50 tablet 0   No current facility-administered medications for this visit.    Allergies  Allergen Reactions  . Codeine Rash  . Phenergan [Promethazine Hcl]  Anxiety    Patient becomes restless & anxious    Review of Systems   Has lost 10 pounds since surgery but his appetite is improving The patient is not required any narcotics He denies fever or productive cough He does have some dry cough when he lies down probably related to a small left pleural effusion  BP 115/70 mmHg  Pulse 67  Resp 20  Ht 6' (1.829 m)  Wt 207 lb (93.895 kg)  BMI 28.07 kg/m2  SpO2 96% Physical Exam Alert and comfortable Lungs with slight diminished breath sounds at left base Heart rate regular, sinus rhythm without murmur or gallop Sternum well-healed and stable Leg incision healed with small seroma and superior aspect of the incision No pedal edema  Diagnostic Tests: Chest x-rays clear with mild left pleural effusion Impression: Continue with plans for cardiac rehabilitation Take Lasix for another week for the pleural effusion-bed with better nutrition and time should resolve the effusion  Plan:return in 4 weeks with followup chest x-ray to follow left pleural effusion 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 He can drive lift up to 20 pounds maximum.  Will discuss using Livaldo for lipid control  In this statin intolerant patient with his cardiology team.   Len Childs, MD [facility] [phone]

## 2015-04-03 ENCOUNTER — Telehealth: Payer: Self-pay | Admitting: Physician Assistant

## 2015-04-03 DIAGNOSIS — I2510 Atherosclerotic heart disease of native coronary artery without angina pectoris: Secondary | ICD-10-CM

## 2015-04-03 NOTE — Telephone Encounter (Signed)
I was contacted by Dr. Prescott Gum. Patient has been intolerant to pravastatin as well.  It is still on his list.  Make sure Pravastatin is DCd. See if he would be willing to try Livalo (Pitavastatin) 1 mg daily. We may have samples. Check Lipids and LFTs in 6 weeks.  If he cannot tolerate Livalo, refer to [facility].   Richardson Dopp, PA-C   04/03/2015 2:39 PM

## 2015-04-06 MED ORDER — PITAVASTATIN CALCIUM 1 MG PO TABS: 1.0000 mg | ORAL_TABLET | Freq: Every day | ORAL | Status: DC

## 2015-04-06 NOTE — Telephone Encounter (Signed)
s/w pt about recommendation from [facility] and Dr. Nils Pyle to stop pravastatin and start Livalo 1 mg daily, FLP/LFT 11/14. Rx sent to [facility], I will look to see if we have samples as well. pt agreeable to plan of care.    By Michae Kava, CMA

## 2015-04-06 NOTE — Addendum Note (Signed)
Addended by: Michae Kava on: 04/06/2015 05:27 PM   Modules accepted: Orders, Medications

## 2015-04-06 NOTE — Telephone Encounter (Signed)
I s/w pt about recommendation from [facility] and Dr. Nils Pyle to stop pravastatin and start Livalo 1 mg daily, FLP/LFT 11/14. Rx sent to [facility], I will look to see if we have samples as well. pt agreeable to plan of care.

## 2015-04-09 ENCOUNTER — Encounter (HOSPITAL_COMMUNITY)
Admission: RE | Admit: 2015-04-09 | Discharge: 2015-04-09 | Disposition: A | Discharge status: Home / Self Care | Payer: 59 | Source: Ambulatory Visit | Attending: Cardiology | Admitting: Cardiology

## 2015-04-09 DIAGNOSIS — Z48812 Encounter for surgical aftercare following surgery on the circulatory system: Secondary | ICD-10-CM | POA: Insufficient documentation

## 2015-04-09 DIAGNOSIS — I252 Old myocardial infarction: Secondary | ICD-10-CM | POA: Insufficient documentation

## 2015-04-09 DIAGNOSIS — Z951 Presence of aortocoronary bypass graft: Secondary | ICD-10-CM | POA: Insufficient documentation

## 2015-04-09 NOTE — Progress Notes (Signed)
Cardiac Rehab Medication Review by a Pharmacist  Does the patient  feel that his/her medications are working for him/her?  Yes  Has the patient been experiencing any side effects to the medications prescribed? Yes  Does the patient measure his/her own blood pressure or blood glucose at home? Yes  Does the patient have any problems obtaining medications due to transportation or finances?   No  Understanding of regimen: Excellent Understanding of indications: Excellent Potential of compliance: Excellent    Pharmacist comments: 54 y/o M in good spirits and in no distress. Endorses a great understanding of his medications and no major issues identified. Pt is currently trying a new statin due to some muscle pain with atorvastatin.   Angela Burke, PharmD Pharmacy Resident Pager: [phone] 04/09/2015 8:15 AM

## 2015-04-13 ENCOUNTER — Encounter (HOSPITAL_COMMUNITY)
Admission: RE | Admit: 2015-04-13 | Discharge: 2015-04-13 | Disposition: A | Discharge status: Home / Self Care | Payer: 59 | Source: Ambulatory Visit | Attending: Cardiology | Admitting: Cardiology

## 2015-04-13 DIAGNOSIS — Z48812 Encounter for surgical aftercare following surgery on the circulatory system: Secondary | ICD-10-CM | POA: Diagnosis not present

## 2015-04-13 DIAGNOSIS — I252 Old myocardial infarction: Secondary | ICD-10-CM | POA: Diagnosis not present

## 2015-04-13 DIAGNOSIS — Z951 Presence of aortocoronary bypass graft: Secondary | ICD-10-CM | POA: Diagnosis not present

## 2015-04-13 LAB — GLUCOSE, CAPILLARY
GLUCOSE-CAPILLARY: 131 mg/dL — AB (ref 65–99)
GLUCOSE-CAPILLARY: 87 mg/dL (ref 65–99)
Glucose-Capillary: 98 mg/dL (ref 65–99)

## 2015-04-13 NOTE — Progress Notes (Signed)
Pt started cardiac rehab today.  Pt tolerated light exercise without difficulty. VSS, telemetry-Sinus rhythm with t wave inversion. This has been previously documented. , asymptomatic.  Medication list reconciled.  Pt verbalized compliance with medications and denies barriers to compliance. PSYCHOSOCIAL ASSESSMENT:  PHQ-0. Pt exhibits positive coping skills, hopeful outlook with supportive family. No psychosocial needs identified at this time, no psychosocial interventions necessary.    Pt enjoys fly fishing and woodworking.   Pt cardiac rehab  goal is  to increase his endurance and heal his sternum.  Pt encouraged to participate in exercising on your own to increase ability to achieve these goals.   Pt long term cardiac rehab goal is get back to a regular routine and to improve his vascular flow..  Pt oriented to exercise equipment and routine.  Understanding verbalized. Initial CBG 87. Joel Berger ate lunch at 11:30. Patient was given a banana and lemonade. Repeat blood sugar was 98 and the  131. Will continue to monitor the patient throughout  the program.

## 2015-04-15 ENCOUNTER — Encounter (HOSPITAL_COMMUNITY)
Admission: RE | Admit: 2015-04-15 | Discharge: 2015-04-15 | Disposition: A | Discharge status: Home / Self Care | Payer: 59 | Source: Ambulatory Visit | Attending: Cardiology | Admitting: Cardiology

## 2015-04-15 DIAGNOSIS — Z48812 Encounter for surgical aftercare following surgery on the circulatory system: Secondary | ICD-10-CM | POA: Diagnosis not present

## 2015-04-15 LAB — GLUCOSE, CAPILLARY
GLUCOSE-CAPILLARY: 126 mg/dL — AB (ref 65–99)
Glucose-Capillary: 141 mg/dL — ABNORMAL HIGH (ref 65–99)

## 2015-04-17 ENCOUNTER — Encounter (HOSPITAL_COMMUNITY)
Admission: RE | Admit: 2015-04-17 | Discharge: 2015-04-17 | Disposition: A | Discharge status: Home / Self Care | Payer: 59 | Source: Ambulatory Visit | Attending: Cardiology | Admitting: Cardiology

## 2015-04-17 DIAGNOSIS — Z48812 Encounter for surgical aftercare following surgery on the circulatory system: Secondary | ICD-10-CM | POA: Diagnosis not present

## 2015-04-17 LAB — GLUCOSE, CAPILLARY
Glucose-Capillary: 133 mg/dL — ABNORMAL HIGH (ref 65–99)
Glucose-Capillary: 106 mg/dL — ABNORMAL HIGH (ref 65–99)

## 2015-04-20 ENCOUNTER — Encounter (HOSPITAL_COMMUNITY)
Admission: RE | Admit: 2015-04-20 | Discharge: 2015-04-20 | Disposition: A | Discharge status: Home / Self Care | Payer: 59 | Source: Ambulatory Visit | Attending: Cardiology | Admitting: Cardiology

## 2015-04-20 DIAGNOSIS — Z48812 Encounter for surgical aftercare following surgery on the circulatory system: Secondary | ICD-10-CM | POA: Diagnosis not present

## 2015-04-20 LAB — GLUCOSE, CAPILLARY: GLUCOSE-CAPILLARY: 125 mg/dL — AB (ref 65–99)

## 2015-04-20 NOTE — Progress Notes (Signed)
Reviewed home exercise guidelines with patient including endpoints, temperature precautions, target heart rate and rate of perceived exertion. Pt plans to walk as his mode of home exercise. Pt was previously biking twice a week for 45 minutes. Pt voices understanding of instructions given. Sol Passer, MS, ACSM CCEP

## 2015-04-22 ENCOUNTER — Encounter (HOSPITAL_COMMUNITY)
Admission: RE | Admit: 2015-04-22 | Discharge: 2015-04-22 | Disposition: A | Discharge status: Home / Self Care | Payer: 59 | Source: Ambulatory Visit | Attending: Cardiology | Admitting: Cardiology

## 2015-04-22 DIAGNOSIS — Z48812 Encounter for surgical aftercare following surgery on the circulatory system: Secondary | ICD-10-CM | POA: Diagnosis not present

## 2015-04-22 LAB — GLUCOSE, CAPILLARY: Glucose-Capillary: 104 mg/dL — ABNORMAL HIGH (ref 65–99)

## 2015-04-23 ENCOUNTER — Other Ambulatory Visit: Payer: 59

## 2015-04-24 ENCOUNTER — Encounter (HOSPITAL_COMMUNITY)
Admission: RE | Admit: 2015-04-24 | Discharge: 2015-04-24 | Disposition: A | Discharge status: Home / Self Care | Payer: 59 | Source: Ambulatory Visit | Attending: Cardiology | Admitting: Cardiology

## 2015-04-24 DIAGNOSIS — Z48812 Encounter for surgical aftercare following surgery on the circulatory system: Secondary | ICD-10-CM | POA: Diagnosis not present

## 2015-04-24 LAB — GLUCOSE, CAPILLARY: GLUCOSE-CAPILLARY: 109 mg/dL — AB (ref 65–99)

## 2015-04-27 ENCOUNTER — Encounter (HOSPITAL_COMMUNITY): Payer: 59

## 2015-04-28 ENCOUNTER — Other Ambulatory Visit: Payer: Self-pay | Admitting: Cardiothoracic Surgery

## 2015-04-28 DIAGNOSIS — Z951 Presence of aortocoronary bypass graft: Secondary | ICD-10-CM

## 2015-04-29 ENCOUNTER — Encounter: Payer: Self-pay | Admitting: Cardiothoracic Surgery

## 2015-04-29 ENCOUNTER — Encounter: Payer: Self-pay | Admitting: Cardiology

## 2015-04-29 ENCOUNTER — Other Ambulatory Visit (INDEPENDENT_AMBULATORY_CARE_PROVIDER_SITE_OTHER): Payer: 59 | Admitting: *Deleted

## 2015-04-29 ENCOUNTER — Encounter (HOSPITAL_COMMUNITY)
Admission: RE | Admit: 2015-04-29 | Discharge: 2015-04-29 | Disposition: A | Discharge status: Home / Self Care | Payer: 59 | Source: Ambulatory Visit | Attending: Cardiology | Admitting: Cardiology

## 2015-04-29 ENCOUNTER — Ambulatory Visit
Admission: RE | Admit: 2015-04-29 | Discharge: 2015-04-29 | Disposition: A | Discharge status: Home / Self Care | Payer: 59 | Source: Ambulatory Visit | Attending: Cardiothoracic Surgery | Admitting: Cardiothoracic Surgery

## 2015-04-29 DIAGNOSIS — I214 Non-ST elevation (NSTEMI) myocardial infarction: Secondary | ICD-10-CM

## 2015-04-29 DIAGNOSIS — Z951 Presence of aortocoronary bypass graft: Secondary | ICD-10-CM

## 2015-04-29 DIAGNOSIS — E785 Hyperlipidemia, unspecified: Secondary | ICD-10-CM | POA: Insufficient documentation

## 2015-04-29 DIAGNOSIS — I2111 ST elevation (STEMI) myocardial infarction involving right coronary artery: Secondary | ICD-10-CM | POA: Diagnosis not present

## 2015-04-29 DIAGNOSIS — I10 Essential (primary) hypertension: Secondary | ICD-10-CM

## 2015-04-29 DIAGNOSIS — Z48812 Encounter for surgical aftercare following surgery on the circulatory system: Secondary | ICD-10-CM | POA: Diagnosis not present

## 2015-04-29 HISTORY — DX: Hyperlipidemia, unspecified: E78.5

## 2015-04-29 LAB — LIPID PANEL
Cholesterol: 137 mg/dL (ref 125–200)
HDL: 35 mg/dL — AB (ref >=40)
LDL CALC: 74 mg/dL (ref <130)
Total CHOL/HDL Ratio: 3.9 Ratio (ref <=5.0)
Triglycerides: 139 mg/dL (ref <150)
VLDL: 28 mg/dL (ref <30)

## 2015-04-29 LAB — HEPATIC FUNCTION PANEL
ALBUMIN: 4 g/dL (ref 3.6–5.1)
ALK PHOS: 112 U/L (ref 40–115)
ALT: 10 U/L (ref 9–46)
AST: 13 U/L (ref 10–35)
BILIRUBIN TOTAL: 0.3 mg/dL (ref 0.2–1.2)
Total Protein: 7.3 g/dL (ref 6.1–8.1)

## 2015-04-29 LAB — GLUCOSE, CAPILLARY
Glucose-Capillary: 110 mg/dL — ABNORMAL HIGH (ref 65–99)
Glucose-Capillary: 89 mg/dL (ref 65–99)

## 2015-04-29 NOTE — Addendum Note (Signed)
Addended by: Eulis Foster on: 04/29/2015 02:16 PM   Modules accepted: Orders

## 2015-04-29 NOTE — Progress Notes (Signed)
Cardiology Office Note   Date:  04/30/2015   ID:  Joel Berger, DOB 09/06/1960, MRN [id number]  PCP:  Christ Kick, MD    Chief Complaint  Patient presents with  . Coronary Artery Disease  . Hyperlipidemia      History of Present Illness: Joel Berger is a 54 y.o. male with a hx of DM2, HTN, FHx of CAD. Admitted 8/28-9/4 with late presenting inferior STEMI. LHC demonstrated an occluded distal RCA, 90% proximal LAD and 70% D1 stenosis. EF was 45-50%. He was seen by Dr. Prescott Gum and underwent CABG with L-LAD, S-D1, S-PDA. Post op course was notable for AFib but converted to NSR. He was placed on Amiodarone. He did require diuresis for volume excess. Echo did demonstrate EF 50-55%.   He returns for follow-up. Since DC from the hospital, he has been doing well. He states that he is feeling better than he did before he went to the hospital. Chest soreness is improved. He did have an episode of chest pressure more like he couldn't take a breath a week ago lasting about 30 minutes and became very tired and went home and slept and since then has been fine.  He did not have any nausea or diaphoresis.  He is in cardiac rehab and has not had any problems with chest discomfort working out.  He denies significant dyspnea. He denies orthopnea, PND. His LE edema has resolved. He denies any dizziness, palpitations or syncope.     Past Medical History  Diagnosis Date  . Diabetes mellitus   . Hypertension   . History of echocardiogram     a. Echo 8/16:  EF 50-55%, inf AK, mild MR  . Carotid stenosis     a. Carotid US 8/16:  bilat ICA 1-39%  . Dyslipidemia   . Atrial fibrillation ([place])     a. post CABG AF >> converted to NSR with Amiodarone  . Dyslipidemia, goal LDL below 70 04/29/2015  . CAD (coronary artery disease)     a. late presenting Inf MI >> LHC with 3v CAD >> s/p CABG with L-LAD, S-D1, S-PDA.    Past Surgical History  Procedure Laterality Date  .  Cardiac catheterization N/A 03/01/2015    Procedure: Left Heart Cath and Coronary Angiography;  Surgeon: Belva Crome, MD;  Location: [facility];  Service: Cardiovascular;  Laterality: N/A;  . Coronary artery bypass graft N/A 03/03/2015    Procedure: CORONARY ARTERY BYPASS GRAFTING (CABG), ON PUMP, TIMES THREE, USING LEFT INTERNAL MAMMARY ARTERY, RIGHT GREATER SAPHENOUS VEIN HARVESTED ENDOSCOPICALLY;  Surgeon: Ivin Poot, MD;  Location: [facility];  Service: Open Heart Surgery;  Laterality: N/A;  LIMA TO LAD, SVG TO DIAGONAL, SVG TO PDA  . Tee without cardioversion N/A 03/03/2015    Procedure: TRANSESOPHAGEAL ECHOCARDIOGRAM (TEE);  Surgeon: Ivin Poot, MD;  Location: [facility];  Service: Open Heart Surgery;  Laterality: N/A;     Current Outpatient Prescriptions  Medication Sig Dispense Refill  . ACCU-CHEK AVIVA PLUS test strip     . amiodarone (PACERONE) 200 MG tablet Take 200 mg by mouth 2 (two) times daily.    Marland Kitchen aspirin EC 325 MG EC tablet Take 1 tablet (325 mg total) by mouth daily.    Marland Kitchen gabapentin (NEURONTIN) 600 MG tablet Take 600 mg by mouth 2 (two) times daily.     Marland Kitchen glipiZIDE (  GLUCOTROL) 10 MG tablet Take 10 mg by mouth Twice daily.     Marland Kitchen lisinopril (PRINIVIL,ZESTRIL) 2.5 MG tablet Take 2.5 mg by mouth Daily.     . metFORMIN (GLUCOPHAGE) 1000 MG tablet Take 1,000 mg by mouth Twice daily.     . metoprolol tartrate (LOPRESSOR) 25 MG tablet Take 0.5 tablets (12.5 mg total) by mouth 2 (two) times daily. 31 tablet 1  . Pitavastatin Calcium 1 MG TABS Take 1 tablet (1 mg total) by mouth at bedtime. 30 tablet 11  . rOPINIRole (REQUIP) 0.5 MG tablet Take 0.5 mg by mouth at bedtime as needed.      No current facility-administered medications for this visit.    Allergies:   Codeine and Phenergan    Social History:  The patient  reports that he has never smoked. He has never used smokeless tobacco. He reports that he drinks alcohol. He reports that he does not use illicit drugs.    Family History:  The patient's family history includes Colon polyps in his paternal uncle; Heart attack in his brother and father; Heart failure in his mother. There is no history of Colon cancer.    ROS:  Please see the history of present illness.   Otherwise, review of systems are positive for none.   All other systems are reviewed and negative.    PHYSICAL EXAM: VS:  BP 134/82 mmHg  Pulse 68  Ht 6' (1.829 m)  Wt 203 lb 3.2 oz (92.171 kg)  BMI 27.55 kg/m2  SpO2 99% , BMI Body mass index is 27.55 kg/(m^2). GEN: Well nourished, well developed, in no acute distress HEENT: normal Neck: no JVD, carotid bruits, or masses Cardiac: RRR; no murmurs, rubs, or gallops,no edema  Respiratory:  clear to auscultation bilaterally, normal work of breathing GI: soft, nontender, nondistended, + BS MS: no deformity or atrophy Skin: warm and dry, no rash Neuro:  Strength and sensation are intact Psych: euthymic mood, full affect   EKG:  EKG is not ordered today.    Recent Labs: 03/01/2015: TSH 0.678 03/04/2015: Magnesium 2.5* 03/06/2015: Hemoglobin 9.9*; Platelets 199 03/17/2015: BUN 12; Creatinine, Ser 0.78; Potassium 3.9; Sodium 137 04/29/2015: ALT 10    Lipid Panel    Component Value Date/Time   CHOL 137 04/29/2015 1416   TRIG 139 04/29/2015 1416   HDL 35* 04/29/2015 1416   CHOLHDL 3.9 04/29/2015 1416   VLDL 28 04/29/2015 1416   LDLCALC 74 04/29/2015 1416      Wt Readings from Last 3 Encounters:  04/30/15 203 lb 3.2 oz (92.171 kg)  04/29/15 196 lb (88.905 kg)  04/09/15 202 lb 6.1 oz (91.8 kg)    ASSESSMENT AND PLAN:  CAD: S/p late presenting inferior STEMI. LHC demonstrated 3 v CAD and he is now s/p CABG. EF is preserved. He is progressing well and denies significant dyspnea. Unsure what chest discomfort episode was a week ago.  I will check a troponin and 2D echo to assess for wall motion abnormalities.  Continue ASA (change to 81mg  daily), beta-blocker, ACE inhibitor.  He has been intol to multiple statins (Atorvastatin, Rosuvastatin, pravastatin). Now on Livalo.  Paroxysmal Atrial Fibrillation: Patient had post op AFib. He is maintaining NSR.Will stop Amiodaroneand continue BB  Diastolic CHF: no volume overload on exam  HTN: Controlled. Continue current Rx of BB and ACE I  Dyslipidemia: Continue on Livalo.  He has a FLP pending in 3 weeks.  Current medicines are reviewed at length with the patient today.  The patient does not have concerns regarding medicines.  The following changes have been made:  Stop Amiodarone  Labs/ tests ordered today: See above Assessment and Plan No orders of the defined types were placed in this encounter.     Disposition:   FU with me in 6 months  Signed, Sueanne Margarita, MD  04/30/2015 8:12 AM    [facility] [address] [place], [place], [place]  [address] Phone: [phone]; Fax: [phone]

## 2015-04-29 NOTE — Progress Notes (Signed)
This encounter was created in error - please disregard.

## 2015-04-30 ENCOUNTER — Ambulatory Visit (INDEPENDENT_AMBULATORY_CARE_PROVIDER_SITE_OTHER): Payer: 59 | Admitting: Cardiology

## 2015-04-30 ENCOUNTER — Encounter: Payer: Self-pay | Admitting: Cardiology

## 2015-04-30 ENCOUNTER — Telehealth: Payer: Self-pay | Admitting: *Deleted

## 2015-04-30 VITALS — BP 134/82 | HR 68 | Ht 72.0 in | Wt 203.2 lb

## 2015-04-30 DIAGNOSIS — R079 Chest pain, unspecified: Secondary | ICD-10-CM

## 2015-04-30 DIAGNOSIS — E785 Hyperlipidemia, unspecified: Secondary | ICD-10-CM

## 2015-04-30 DIAGNOSIS — I10 Essential (primary) hypertension: Secondary | ICD-10-CM | POA: Diagnosis not present

## 2015-04-30 DIAGNOSIS — I2510 Atherosclerotic heart disease of native coronary artery without angina pectoris: Secondary | ICD-10-CM | POA: Diagnosis not present

## 2015-04-30 DIAGNOSIS — I480 Paroxysmal atrial fibrillation: Secondary | ICD-10-CM | POA: Diagnosis not present

## 2015-04-30 LAB — BASIC METABOLIC PANEL
BUN: 14 mg/dL (ref 7–25)
CALCIUM: 8.9 mg/dL (ref 8.6–10.3)
CO2: 31 mmol/L (ref 20–31)
Chloride: 101 mmol/L (ref 98–110)
Creat: 0.75 mg/dL (ref 0.70–1.33)
GLUCOSE: 121 mg/dL — AB (ref 65–99)
POTASSIUM: 4.3 mmol/L (ref 3.5–5.3)
SODIUM: 139 mmol/L (ref 135–146)

## 2015-04-30 LAB — TROPONIN I: TROPONIN I: 0.01 ng/mL (ref <0.06)

## 2015-04-30 NOTE — Patient Instructions (Addendum)
Medication Instructions:  Your physician has recommended you make the following change in your medication: 1) STOP AMIODARONE  Labwork: TODAY: TROPONIN, BMET  Testing/Procedures: Your physician has requested that you have an echocardiogram. Echocardiography is a painless test that uses sound waves to create images of your heart. It provides your doctor with information about the size and shape of your heart and how well your heart's chambers and valves are working. This procedure takes approximately one hour. There are no restrictions for this procedure.  Follow-Up: Your physician wants you to follow-up in: 6 months with Dr. Radford Pax. You will receive a reminder letter in the mail two months in advance. If you don't receive a letter, please call our office to schedule the follow-up appointment.   Any Other Special Instructions Will Be Listed Below (If Applicable).     If you need a refill on your cardiac medications before your next appointment, please call your pharmacy.

## 2015-04-30 NOTE — Telephone Encounter (Signed)
Pt notified of lab results by phone with verbal understanding.  

## 2015-05-01 ENCOUNTER — Encounter (HOSPITAL_COMMUNITY)
Admission: RE | Admit: 2015-05-01 | Discharge: 2015-05-01 | Disposition: A | Discharge status: Home / Self Care | Payer: 59 | Source: Ambulatory Visit | Attending: Cardiology | Admitting: Cardiology

## 2015-05-01 DIAGNOSIS — Z48812 Encounter for surgical aftercare following surgery on the circulatory system: Secondary | ICD-10-CM | POA: Diagnosis not present

## 2015-05-01 NOTE — Progress Notes (Signed)
QUALITY OF LIFE SCORE REVIEW Patient score low in area of socioeconomic and family  with a score of 20.36 and 19.  Scores less than 21 are considered low.  Patient quality of life slightly altered by physical constraints which limits ability to perform as prior to recent cardiac illness. Joel Berger denies being depressed at this time.  Offered emotional support and reassurance.  Will continue to monitor and intervene as necessary. Will fax [name]'s quality of life questionnaire to Dr Theodosia Blender office for review.

## 2015-05-02 LAB — GLUCOSE, CAPILLARY: GLUCOSE-CAPILLARY: 107 mg/dL — AB (ref 65–99)

## 2015-05-04 ENCOUNTER — Telehealth: Payer: Self-pay | Admitting: Cardiology

## 2015-05-04 ENCOUNTER — Encounter (HOSPITAL_COMMUNITY)
Admission: RE | Admit: 2015-05-04 | Discharge: 2015-05-04 | Disposition: A | Discharge status: Home / Self Care | Payer: 59 | Source: Ambulatory Visit | Attending: Cardiology | Admitting: Cardiology

## 2015-05-04 DIAGNOSIS — I493 Ventricular premature depolarization: Secondary | ICD-10-CM

## 2015-05-04 DIAGNOSIS — Z48812 Encounter for surgical aftercare following surgery on the circulatory system: Secondary | ICD-10-CM | POA: Diagnosis not present

## 2015-05-04 NOTE — Telephone Encounter (Signed)
Patient st at cardiac rehab today he was experiencing lots of PVCs while on the stepper and the nurses were concerned. He said he went to the treadmill after and PVCs stopped. Also, over the weekend (from Friday to Monday), he gained 6 pounds.  They told him at Cardiac Rehab his lungs were clear and he has no swelling.  Patient is totally asymptomatic - no edema, SOB, CP. Confirmed with patient he was using the same scale.  To Dr. Radford Pax for recommendations.

## 2015-05-04 NOTE — Progress Notes (Signed)
Pt weight up 6 lb at cardiac rehab since 05/01/15.  Pt denies dyspnea or edema.  Pt lungs clear, no edema.  O2 sat-96%.  Pt reports he DC lasix last week per Dr. Lawson Fiscal.  Pt also noted to have bigeminal PVC at cardiac rehab during exercise.  New onset for pt.  Pt asymptomatic.  Pt DC amiodarone last week per Dr. Radford Pax.  Phone message left for Dr. Theodosia Blender nurse to return call.  Strips faxed to Dr. Radford Pax for review.

## 2015-05-04 NOTE — Telephone Encounter (Signed)
New Prob   Has some questions regarding pts medications. Also states pt is having frequent PVCs. Please call.

## 2015-05-04 NOTE — Progress Notes (Signed)
Joel Berger 54 y.o. male Nutrition Note Spoke with pt. Nutrition Plan and Nutrition Survey goals reviewed with pt. Pt is following Step 2 of the Therapeutic Lifestyle Changes diet. Pt wants to lose wt. Pt has not been actively trying to lose wt. Pt reports his wt is up 6 lb today, "which may be due to lasix stopped." Wt loss tips briefly reviewed.  Pt is diabetic. Last A1c indicates blood glucose not optimally controlled. This Probation officer went over Diabetes Education test results. Pt does not check CBG's at this time because "my insurance doesn't cover my strips for my current meter and I'm not paying $95 for test strips." Pt declined this writer's offer for a glucometer and test strips to tide him over until insurance issue resolved. 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 Pt expressed understanding of the information reviewed. Pt aware of nutrition education classes offered and is unable to attend nutrition classes.  Lab Results  Component Value Date   HGBA1C 7.9* 03/03/2015    Nutrition Diagnosis ? Food-and nutrition-related knowledge deficit related to lack of exposure to information as related to diagnosis of: ? CVD ? DM ? Overweight related to excessive energy intake as evidenced by a BMI of 28  Nutrition RX/ Estimated Daily Nutrition Needs for: wt 3loss 1700-2200 Kcal, 45-60 gm fat, 11-15 gm sat fat, 1.6-2.2 gm trans-fat, <1500 mg sodium, 250 gm CHO   Nutrition Intervention ? Pt's individual nutrition plan reviewed with pt. ? Benefits of adopting Therapeutic Lifestyle Changes discussed when Medficts reviewed. ? Pt to attend the Portion Distortion class ? Pt to attend the Diabetes Q & A class  ? ? If pt may benefit from changing Glipizide to GLP-1 RA (e.g. Victoza or Byetta) ? Pt given handouts for: ? Nutrition I class ? Nutrition II class ? Diabetes Blitz Class ? Continue client-centered nutrition education by RD, as part of interdisciplinary care. Goal(s) ?  ? Pt to  identify food quantities necessary to achieve: ? wt loss to a goal wt of 178-196 lb (80.9-89.1 kg) at graduation from cardiac rehab.  ? Use pre-meal and post-meal CBG's and A1c to determine whether adjustments in food/meal planning will be beneficial or if any meds need to be combined with nutrition therapy. Monitor and Evaluate progress toward nutrition goal with team. Nutrition Risk: Change to Moderate [name], M.Ed, RD, LDN, CDE 05/04/2015 3:33 PM

## 2015-05-05 LAB — GLUCOSE, CAPILLARY: Glucose-Capillary: 173 mg/dL — ABNORMAL HIGH (ref 65–99)

## 2015-05-05 NOTE — Telephone Encounter (Signed)
EF is low normal.  Please get a 24 hour holter to assess PVC load.  Also, find out if patient is feeling palpitations

## 2015-05-05 NOTE — Telephone Encounter (Signed)
Patient is not having any palpitations. 24 hour holter ordered for scheduling. Patient agrees with treatment plan.

## 2015-05-06 ENCOUNTER — Ambulatory Visit (INDEPENDENT_AMBULATORY_CARE_PROVIDER_SITE_OTHER): Payer: 59

## 2015-05-06 ENCOUNTER — Telehealth: Payer: Self-pay

## 2015-05-06 ENCOUNTER — Encounter (HOSPITAL_COMMUNITY)
Admission: RE | Admit: 2015-05-06 | Discharge: 2015-05-06 | Disposition: A | Discharge status: Home / Self Care | Payer: 59 | Source: Ambulatory Visit | Attending: Cardiology | Admitting: Cardiology

## 2015-05-06 ENCOUNTER — Other Ambulatory Visit (HOSPITAL_COMMUNITY): Payer: 59

## 2015-05-06 DIAGNOSIS — Z951 Presence of aortocoronary bypass graft: Secondary | ICD-10-CM | POA: Diagnosis not present

## 2015-05-06 DIAGNOSIS — Z48812 Encounter for surgical aftercare following surgery on the circulatory system: Secondary | ICD-10-CM | POA: Insufficient documentation

## 2015-05-06 DIAGNOSIS — I493 Ventricular premature depolarization: Secondary | ICD-10-CM

## 2015-05-06 DIAGNOSIS — I252 Old myocardial infarction: Secondary | ICD-10-CM | POA: Insufficient documentation

## 2015-05-06 LAB — GLUCOSE, CAPILLARY: GLUCOSE-CAPILLARY: 129 mg/dL — AB (ref 65–99)

## 2015-05-06 NOTE — Telephone Encounter (Signed)
Patient's pharmacy is questioning if patient is to be taking the Lisinopril and Metoprolol at the same time. He called in concerned.  Please advise.

## 2015-05-06 NOTE — Telephone Encounter (Signed)
Left message for patient that those medications are compatible, but to contact prescribing MD for further questions. Instructed patient to call back with further questions or concerns.

## 2015-05-08 ENCOUNTER — Encounter (HOSPITAL_COMMUNITY)
Admission: RE | Admit: 2015-05-08 | Discharge: 2015-05-08 | Disposition: A | Discharge status: Home / Self Care | Payer: 59 | Source: Ambulatory Visit | Attending: Cardiology | Admitting: Cardiology

## 2015-05-08 DIAGNOSIS — Z48812 Encounter for surgical aftercare following surgery on the circulatory system: Secondary | ICD-10-CM | POA: Diagnosis not present

## 2015-05-08 LAB — GLUCOSE, CAPILLARY: Glucose-Capillary: 150 mg/dL — ABNORMAL HIGH (ref 65–99)

## 2015-05-11 ENCOUNTER — Encounter (HOSPITAL_COMMUNITY)
Admission: RE | Admit: 2015-05-11 | Discharge: 2015-05-11 | Disposition: A | Discharge status: Home / Self Care | Payer: 59 | Source: Ambulatory Visit | Attending: Cardiology | Admitting: Cardiology

## 2015-05-11 ENCOUNTER — Other Ambulatory Visit (HOSPITAL_COMMUNITY): Payer: 59

## 2015-05-11 DIAGNOSIS — Z48812 Encounter for surgical aftercare following surgery on the circulatory system: Secondary | ICD-10-CM | POA: Diagnosis not present

## 2015-05-11 LAB — GLUCOSE, CAPILLARY: Glucose-Capillary: 166 mg/dL — ABNORMAL HIGH (ref 65–99)

## 2015-05-13 ENCOUNTER — Encounter (HOSPITAL_COMMUNITY): Payer: 59

## 2015-05-14 ENCOUNTER — Telehealth: Payer: Self-pay

## 2015-05-14 ENCOUNTER — Telehealth (HOSPITAL_COMMUNITY): Payer: Self-pay

## 2015-05-14 DIAGNOSIS — I10 Essential (primary) hypertension: Secondary | ICD-10-CM

## 2015-05-14 NOTE — Telephone Encounter (Signed)
Fax received from Cardiac Rehab containing EKG with PVCs. Per Dr. Radford Pax, patient to come in for BMET on Monday, November 14.

## 2015-05-14 NOTE — Telephone Encounter (Signed)
Received fax from Cardiac Rehab concerning his quality of life questionnaire.  Per Dr. Radford Pax, faxed questionnaire to PCP.

## 2015-05-15 ENCOUNTER — Encounter (HOSPITAL_COMMUNITY)
Admission: RE | Admit: 2015-05-15 | Discharge: 2015-05-15 | Disposition: A | Discharge status: Home / Self Care | Payer: 59 | Source: Ambulatory Visit | Attending: Cardiology | Admitting: Cardiology

## 2015-05-15 DIAGNOSIS — Z48812 Encounter for surgical aftercare following surgery on the circulatory system: Secondary | ICD-10-CM | POA: Diagnosis not present

## 2015-05-15 LAB — GLUCOSE, CAPILLARY: Glucose-Capillary: 108 mg/dL — ABNORMAL HIGH (ref 65–99)

## 2015-05-18 ENCOUNTER — Other Ambulatory Visit: Payer: 59

## 2015-05-18 ENCOUNTER — Encounter (HOSPITAL_COMMUNITY)
Admission: RE | Admit: 2015-05-18 | Discharge: 2015-05-18 | Disposition: A | Discharge status: Home / Self Care | Payer: 59 | Source: Ambulatory Visit | Attending: Cardiology | Admitting: Cardiology

## 2015-05-18 ENCOUNTER — Ambulatory Visit (HOSPITAL_COMMUNITY): Payer: 59 | Attending: Cardiovascular Disease

## 2015-05-18 ENCOUNTER — Other Ambulatory Visit (INDEPENDENT_AMBULATORY_CARE_PROVIDER_SITE_OTHER): Payer: 59 | Admitting: *Deleted

## 2015-05-18 ENCOUNTER — Other Ambulatory Visit: Payer: Self-pay

## 2015-05-18 DIAGNOSIS — I340 Nonrheumatic mitral (valve) insufficiency: Secondary | ICD-10-CM | POA: Insufficient documentation

## 2015-05-18 DIAGNOSIS — I10 Essential (primary) hypertension: Secondary | ICD-10-CM

## 2015-05-18 DIAGNOSIS — R079 Chest pain, unspecified: Secondary | ICD-10-CM | POA: Diagnosis not present

## 2015-05-18 DIAGNOSIS — I517 Cardiomegaly: Secondary | ICD-10-CM | POA: Insufficient documentation

## 2015-05-18 DIAGNOSIS — Z48812 Encounter for surgical aftercare following surgery on the circulatory system: Secondary | ICD-10-CM | POA: Diagnosis not present

## 2015-05-18 LAB — BASIC METABOLIC PANEL
BUN: 13 mg/dL (ref 7–25)
CALCIUM: 9.3 mg/dL (ref 8.6–10.3)
CO2: 29 mmol/L (ref 20–31)
CREATININE: 0.73 mg/dL (ref 0.70–1.33)
Chloride: 100 mmol/L (ref 98–110)
Glucose, Bld: 107 mg/dL — ABNORMAL HIGH (ref 65–99)
Potassium: 4.3 mmol/L (ref 3.5–5.3)
SODIUM: 141 mmol/L (ref 135–146)

## 2015-05-19 LAB — GLUCOSE, CAPILLARY: Glucose-Capillary: 207 mg/dL — ABNORMAL HIGH (ref 65–99)

## 2015-05-20 ENCOUNTER — Encounter (HOSPITAL_COMMUNITY)
Admission: RE | Admit: 2015-05-20 | Discharge: 2015-05-20 | Disposition: A | Discharge status: Home / Self Care | Payer: 59 | Source: Ambulatory Visit | Attending: Cardiology | Admitting: Cardiology

## 2015-05-20 DIAGNOSIS — Z48812 Encounter for surgical aftercare following surgery on the circulatory system: Secondary | ICD-10-CM | POA: Diagnosis not present

## 2015-05-21 LAB — GLUCOSE, CAPILLARY: GLUCOSE-CAPILLARY: 146 mg/dL — AB (ref 65–99)

## 2015-05-22 ENCOUNTER — Encounter (HOSPITAL_COMMUNITY)
Admission: RE | Admit: 2015-05-22 | Discharge: 2015-05-22 | Disposition: A | Discharge status: Home / Self Care | Payer: 59 | Source: Ambulatory Visit | Attending: Cardiology | Admitting: Cardiology

## 2015-05-22 DIAGNOSIS — Z48812 Encounter for surgical aftercare following surgery on the circulatory system: Secondary | ICD-10-CM | POA: Diagnosis not present

## 2015-05-23 LAB — GLUCOSE, CAPILLARY
GLUCOSE-CAPILLARY: 107 mg/dL — AB (ref 65–99)
Glucose-Capillary: 114 mg/dL — ABNORMAL HIGH (ref 65–99)

## 2015-05-25 ENCOUNTER — Encounter (HOSPITAL_COMMUNITY)
Admission: RE | Admit: 2015-05-25 | Discharge: 2015-05-25 | Disposition: A | Discharge status: Home / Self Care | Payer: 59 | Source: Ambulatory Visit | Attending: Cardiology | Admitting: Cardiology

## 2015-05-25 DIAGNOSIS — Z48812 Encounter for surgical aftercare following surgery on the circulatory system: Secondary | ICD-10-CM | POA: Diagnosis not present

## 2015-05-25 LAB — GLUCOSE, CAPILLARY: Glucose-Capillary: 152 mg/dL — ABNORMAL HIGH (ref 65–99)

## 2015-05-27 ENCOUNTER — Encounter (HOSPITAL_COMMUNITY): Payer: 59

## 2015-06-01 ENCOUNTER — Encounter: Payer: Self-pay | Admitting: Cardiology

## 2015-06-01 ENCOUNTER — Encounter (HOSPITAL_COMMUNITY)
Admission: RE | Admit: 2015-06-01 | Discharge: 2015-06-01 | Disposition: A | Discharge status: Home / Self Care | Payer: 59 | Source: Ambulatory Visit | Attending: Cardiology | Admitting: Cardiology

## 2015-06-01 DIAGNOSIS — Z48812 Encounter for surgical aftercare following surgery on the circulatory system: Secondary | ICD-10-CM | POA: Diagnosis not present

## 2015-06-01 LAB — GLUCOSE, CAPILLARY: Glucose-Capillary: 168 mg/dL — ABNORMAL HIGH (ref 65–99)

## 2015-06-01 NOTE — Progress Notes (Signed)
  30 day Psychosocial followup assessment  Patient psychosocial assessment reveals no barriers to cardiac rehab participation.  Patient's rate of progress towards goals is good.

## 2015-06-03 ENCOUNTER — Encounter (HOSPITAL_COMMUNITY): Payer: 59

## 2015-06-05 ENCOUNTER — Encounter (HOSPITAL_COMMUNITY)
Admission: RE | Admit: 2015-06-05 | Discharge: 2015-06-05 | Disposition: A | Discharge status: Home / Self Care | Payer: 59 | Source: Ambulatory Visit | Attending: Cardiology | Admitting: Cardiology

## 2015-06-05 DIAGNOSIS — Z48812 Encounter for surgical aftercare following surgery on the circulatory system: Secondary | ICD-10-CM | POA: Insufficient documentation

## 2015-06-05 DIAGNOSIS — Z951 Presence of aortocoronary bypass graft: Secondary | ICD-10-CM | POA: Diagnosis not present

## 2015-06-05 DIAGNOSIS — I252 Old myocardial infarction: Secondary | ICD-10-CM | POA: Insufficient documentation

## 2015-06-05 LAB — GLUCOSE, CAPILLARY: Glucose-Capillary: 223 mg/dL — ABNORMAL HIGH (ref 65–99)

## 2015-06-08 ENCOUNTER — Encounter (HOSPITAL_COMMUNITY): Payer: 59

## 2015-06-10 ENCOUNTER — Encounter (HOSPITAL_COMMUNITY)
Admission: RE | Admit: 2015-06-10 | Discharge: 2015-06-10 | Disposition: A | Discharge status: Home / Self Care | Payer: 59 | Source: Ambulatory Visit | Attending: Cardiology | Admitting: Cardiology

## 2015-06-10 DIAGNOSIS — Z48812 Encounter for surgical aftercare following surgery on the circulatory system: Secondary | ICD-10-CM | POA: Diagnosis not present

## 2015-06-11 LAB — GLUCOSE, CAPILLARY: GLUCOSE-CAPILLARY: 153 mg/dL — AB (ref 65–99)

## 2015-06-12 ENCOUNTER — Encounter (HOSPITAL_COMMUNITY): Payer: 59

## 2015-06-15 ENCOUNTER — Encounter (HOSPITAL_COMMUNITY)
Admission: RE | Admit: 2015-06-15 | Discharge: 2015-06-15 | Disposition: A | Discharge status: Home / Self Care | Payer: 59 | Source: Ambulatory Visit | Attending: Cardiology | Admitting: Cardiology

## 2015-06-15 DIAGNOSIS — Z48812 Encounter for surgical aftercare following surgery on the circulatory system: Secondary | ICD-10-CM | POA: Diagnosis not present

## 2015-06-15 LAB — GLUCOSE, CAPILLARY: Glucose-Capillary: 174 mg/dL — ABNORMAL HIGH (ref 65–99)

## 2015-06-17 ENCOUNTER — Encounter (HOSPITAL_COMMUNITY): Payer: 59

## 2015-06-19 ENCOUNTER — Encounter (HOSPITAL_COMMUNITY): Payer: 59

## 2015-06-22 ENCOUNTER — Encounter (HOSPITAL_COMMUNITY)
Admission: RE | Admit: 2015-06-22 | Discharge: 2015-06-22 | Disposition: A | Discharge status: Home / Self Care | Payer: 59 | Source: Ambulatory Visit | Attending: Cardiology | Admitting: Cardiology

## 2015-06-22 DIAGNOSIS — Z48812 Encounter for surgical aftercare following surgery on the circulatory system: Secondary | ICD-10-CM | POA: Diagnosis not present

## 2015-06-22 LAB — GLUCOSE, CAPILLARY: GLUCOSE-CAPILLARY: 160 mg/dL — AB (ref 65–99)

## 2015-06-24 ENCOUNTER — Encounter (HOSPITAL_COMMUNITY)
Admission: RE | Admit: 2015-06-24 | Discharge: 2015-06-24 | Disposition: A | Discharge status: Home / Self Care | Payer: 59 | Source: Ambulatory Visit | Attending: Cardiology | Admitting: Cardiology

## 2015-06-24 DIAGNOSIS — Z48812 Encounter for surgical aftercare following surgery on the circulatory system: Secondary | ICD-10-CM | POA: Diagnosis not present

## 2015-06-25 LAB — GLUCOSE, CAPILLARY: GLUCOSE-CAPILLARY: 198 mg/dL — AB (ref 65–99)

## 2015-06-26 ENCOUNTER — Encounter (HOSPITAL_COMMUNITY)
Admission: RE | Admit: 2015-06-26 | Discharge: 2015-06-26 | Disposition: A | Discharge status: Home / Self Care | Payer: 59 | Source: Ambulatory Visit | Attending: Cardiology | Admitting: Cardiology

## 2015-06-26 ENCOUNTER — Telehealth (HOSPITAL_COMMUNITY): Payer: Self-pay | Admitting: *Deleted

## 2015-06-26 DIAGNOSIS — Z48812 Encounter for surgical aftercare following surgery on the circulatory system: Secondary | ICD-10-CM | POA: Diagnosis not present

## 2015-06-26 LAB — GLUCOSE, CAPILLARY: Glucose-Capillary: 138 mg/dL — ABNORMAL HIGH (ref 65–99)

## 2015-06-26 NOTE — Progress Notes (Addendum)
Intermittent exertional blood pressure elevations noted at cardiac rehab. Will fax exercise flow sheets to Dr. Theodosia Blender office for review. Marden Noble said he stopped taking his betablocker 6 weeks because he thought he was told to stop taking it. Will notify Dr Radford Pax.

## 2015-06-29 ENCOUNTER — Encounter (HOSPITAL_COMMUNITY): Payer: 59

## 2015-07-01 ENCOUNTER — Encounter (HOSPITAL_COMMUNITY): Payer: 59

## 2015-07-02 ENCOUNTER — Telehealth (HOSPITAL_COMMUNITY): Payer: Self-pay | Admitting: *Deleted

## 2015-07-02 NOTE — Progress Notes (Signed)
Pt graduated from cardiac rehab program tomorrow with completion of 25 exercise sessions in Phase II. Pt maintained good attendance and progressed nicely during his participation in rehab as evidenced by increased MET level.   Medication list reconciled. Repeat  PHQ score- 0 .  Pt has made significant lifestyle changes and should be commended for his success. Pt feels he has achieved his goals during cardiac rehab.   Pt plans to continue exercise by mountain biking.

## 2015-07-03 ENCOUNTER — Encounter (HOSPITAL_COMMUNITY)
Admission: RE | Admit: 2015-07-03 | Discharge: 2015-07-03 | Disposition: A | Discharge status: Home / Self Care | Payer: 59 | Source: Ambulatory Visit | Attending: Cardiology | Admitting: Cardiology

## 2015-07-03 DIAGNOSIS — Z48812 Encounter for surgical aftercare following surgery on the circulatory system: Secondary | ICD-10-CM | POA: Diagnosis not present

## 2015-07-03 LAB — GLUCOSE, CAPILLARY: GLUCOSE-CAPILLARY: 210 mg/dL — AB (ref 65–99)

## 2015-07-14 ENCOUNTER — Telehealth: Payer: Self-pay | Admitting: *Deleted

## 2015-07-14 MED ORDER — METOPROLOL TARTRATE 25 MG PO TABS: 12.5000 mg | ORAL_TABLET | Freq: Two times a day (BID) | ORAL | Status: DC

## 2015-07-14 NOTE — Telephone Encounter (Signed)
Pt advised I will forward to Dr Turner for review. 

## 2015-07-14 NOTE — Telephone Encounter (Signed)
Dr Theodosia Blender recommendations in response to Cardiac Rehab report of high BPs:  Pt needs to restart metoprolol 12.5mg  bid and follow up in [facility]N Clinic in 1-2 weeks.   07/14/15--LMTCB for pt.

## 2015-07-14 NOTE — Telephone Encounter (Signed)
Pt states that his BP is not high when he checks it at home.  Pt states he will restart metoprolol 12.5mg  bid. Pt declined follow up appointment in [facility] Clinic in 1-2 weeks.  Pt states that he is able to check his BP and pulse at home. Pt states he will restart metoprolol 12.mg bid, check his BP and pulse daily, and call in about 10 days with the readings.

## 2015-08-21 ENCOUNTER — Encounter: Payer: Self-pay | Admitting: Cardiology

## 2015-09-14 ENCOUNTER — Other Ambulatory Visit: Payer: Self-pay | Admitting: *Deleted

## 2015-09-14 MED ORDER — METOPROLOL TARTRATE 25 MG PO TABS: 12.5000 mg | ORAL_TABLET | Freq: Two times a day (BID) | ORAL | Status: DC

## 2015-11-02 ENCOUNTER — Encounter: Payer: Self-pay | Admitting: Cardiology

## 2015-11-02 ENCOUNTER — Ambulatory Visit (INDEPENDENT_AMBULATORY_CARE_PROVIDER_SITE_OTHER): Payer: BLUE CROSS/BLUE SHIELD | Admitting: Cardiology

## 2015-11-02 VITALS — BP 116/78 | HR 64 | Ht 71.0 in | Wt 211.4 lb

## 2015-11-02 DIAGNOSIS — E785 Hyperlipidemia, unspecified: Secondary | ICD-10-CM | POA: Diagnosis not present

## 2015-11-02 DIAGNOSIS — I10 Essential (primary) hypertension: Secondary | ICD-10-CM

## 2015-11-02 DIAGNOSIS — I6523 Occlusion and stenosis of bilateral carotid arteries: Secondary | ICD-10-CM

## 2015-11-02 DIAGNOSIS — I6529 Occlusion and stenosis of unspecified carotid artery: Secondary | ICD-10-CM

## 2015-11-02 DIAGNOSIS — I2510 Atherosclerotic heart disease of native coronary artery without angina pectoris: Secondary | ICD-10-CM

## 2015-11-02 HISTORY — DX: Occlusion and stenosis of unspecified carotid artery: I65.29

## 2015-11-02 LAB — HEPATIC FUNCTION PANEL
ALT: 14 U/L (ref 9–46)
AST: 12 U/L (ref 10–35)
Albumin: 4.3 g/dL (ref 3.6–5.1)
Alkaline Phosphatase: 106 U/L (ref 40–115)
BILIRUBIN DIRECT: 0.2 mg/dL (ref <=0.2)
BILIRUBIN INDIRECT: 0.6 mg/dL (ref 0.2–1.2)
BILIRUBIN TOTAL: 0.8 mg/dL (ref 0.2–1.2)
Total Protein: 6.6 g/dL (ref 6.1–8.1)

## 2015-11-02 LAB — LIPID PANEL
CHOL/HDL RATIO: 4.6 ratio (ref <=5.0)
CHOLESTEROL: 165 mg/dL (ref 125–200)
HDL: 36 mg/dL — ABNORMAL LOW (ref >=40)
LDL CALC: 106 mg/dL (ref <130)
TRIGLYCERIDES: 114 mg/dL (ref <150)
VLDL: 23 mg/dL (ref <30)

## 2015-11-02 NOTE — Progress Notes (Signed)
Cardiology Office Note    Date:  11/02/2015   ID:  Joel GOETTE, DOB 05-Dec-1960, MRN [id number]  PCP:  Christ Kick, MD  Cardiologist:  Sueanne Margarita, MD   Chief Complaint  Patient presents with  . Coronary Artery Disease  . Hypertension    History of Present Illness:  Joel Berger is a 55 y.o. 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 Post op course was notable for AFib but converted to NSR. He was placed on Amiodarone.  He returns for follow-up.  He is in cardiac rehab and has not had any problems with chest discomfort working out. He has had a few episodes chest pressure for 10-15 minutes that was reminiscent of his prior angina but very mil, occurred at resta and resolved on its own.  He went Kayaking yesterday for 5 hours and then came home and walked an hour without any problems.  He works out on the treadmill without any problems.  He denies significant dyspnea. He denies orthopnea, PND. His LE edema has resolved. He denies any dizziness, palpitations or syncope.     Past Medical History  Diagnosis Date  . Diabetes mellitus   . Hypertension   . History of echocardiogram     a. Echo 8/16:  EF 50-55%, inf AK, mild MR  . Carotid stenosis     a. Carotid US 8/16:  bilat ICA 1-39%  . Dyslipidemia   . Atrial fibrillation ([facility])     a. post CABG AF >> converted to NSR with Amiodarone  . Dyslipidemia, goal LDL below 70 04/29/2015  . CAD (coronary artery disease)     a. late presenting Inf MI >> LHC with 3v CAD >> s/p CABG with L-LAD, S-D1, S-PDA.  . Carotid artery stenosis 11/02/2015    Past Surgical History  Procedure Laterality Date  . Cardiac catheterization N/A 03/01/2015    Procedure: Left Heart Cath and Coronary Angiography;  Surgeon: Belva Crome, MD;  Location: [facility];  Service: Cardiovascular;  Laterality:  N/A;  . Coronary artery bypass graft N/A 03/03/2015    Procedure: CORONARY ARTERY BYPASS GRAFTING (CABG), ON PUMP, TIMES THREE, USING LEFT INTERNAL MAMMARY ARTERY, RIGHT GREATER SAPHENOUS VEIN HARVESTED ENDOSCOPICALLY;  Surgeon: Ivin Poot, MD;  Location: [facility];  Service: Open Heart Surgery;  Laterality: N/A;  LIMA TO LAD, SVG TO DIAGONAL, SVG TO PDA  . Tee without cardioversion N/A 03/03/2015    Procedure: TRANSESOPHAGEAL ECHOCARDIOGRAM (TEE);  Surgeon: Ivin Poot, MD;  Location: [facility];  Service: Open Heart Surgery;  Laterality: N/A;    Current Medications: Outpatient Prescriptions Prior to Visit  Medication Sig Dispense Refill  . ACCU-CHEK AVIVA PLUS test strip 1 each by Other route as directed.     Marland Kitchen aspirin EC 325 MG EC tablet Take 1 tablet (325 mg total) by mouth daily.    Marland Kitchen gabapentin (NEURONTIN) 600 MG tablet Take 600 mg by mouth 2 (two) times daily.     Marland Kitchen glipiZIDE (GLUCOTROL) 10 MG tablet Take 10 mg by mouth Twice daily.     Marland Kitchen lisinopril (PRINIVIL,ZESTRIL) 2.5 MG tablet Take 2.5 mg by mouth Daily.     . metFORMIN (GLUCOPHAGE) 1000 MG tablet Take 1,000 mg by mouth Twice daily.     . metoprolol tartrate (LOPRESSOR) 25 MG tablet Take 0.5  tablets (12.5 mg total) by mouth 2 (two) times daily. 30 tablet 5  . Pitavastatin Calcium 1 MG TABS Take 1 tablet (1 mg total) by mouth at bedtime. 30 tablet 11  . rOPINIRole (REQUIP) 0.5 MG tablet Take 0.5 mg by mouth at bedtime as needed (restless legs).      No facility-administered medications prior to visit.     Allergies:   Codeine and Phenergan   Social History   Social History  . Marital Status: Single    Spouse Name: N/A  . Number of Children: N/A  . Years of Education: N/A   Social History Main Topics  . Smoking status: Never Smoker   . Smokeless tobacco: Never Used  . Alcohol Use: Yes     Comment: OCCASIONAL BEER,ONCE WEEK  . Drug Use: No  . Sexual Activity: Not Asked   Other Topics Concern  . None   Social History  Narrative     Family History:  The patient's family history includes Colon polyps in his paternal uncle; Heart attack in his brother and father; Heart failure in his mother. There is no history of Colon cancer.   ROS:   Please see the history of present illness.    ROS All other systems reviewed and are negative.   PHYSICAL EXAM:   VS:  BP 116/78 mmHg  Pulse 64  Ht 5\' 11"  (1.803 m)  Wt 211 lb 6.4 oz (95.89 kg)  BMI 29.50 kg/m2   GEN: Well nourished, well developed, in no acute distress HEENT: normal Neck: no JVD, carotid bruits, or masses Cardiac: RRR; no murmurs, rubs, or gallops,no edema.  Intact distal pulses bilaterally.  Respiratory:  clear to auscultation bilaterally, normal work of breathing GI: soft, nontender, nondistended, + BS MS: no deformity or atrophy Skin: warm and dry, no rash Neuro:  Alert and Oriented x 3, Strength and sensation are intact Psych: euthymic mood, full affect  Wt Readings from Last 3 Encounters:  11/02/15 211 lb 6.4 oz (95.89 kg)  04/30/15 203 lb 3.2 oz (92.171 kg)  04/29/15 196 lb (88.905 kg)      Studies/Labs Reviewed:   EKG:  EKG is no ordered today.   Recent Labs: 03/01/2015: TSH 0.678 03/04/2015: Magnesium 2.5* 03/06/2015: Hemoglobin 9.9*; Platelets 199 04/29/2015: ALT 10 05/18/2015: BUN 13; Creat 0.73; Potassium 4.3; Sodium 141   Lipid Panel    Component Value Date/Time   CHOL 137 04/29/2015 1416   TRIG 139 04/29/2015 1416   HDL 35* 04/29/2015 1416   CHOLHDL 3.9 04/29/2015 1416   VLDL 28 04/29/2015 1416   LDLCALC 74 04/29/2015 1416    Additional studies/ records that were reviewed today include:  none    ASSESSMENT:    1. Coronary artery disease involving native coronary artery of native heart without angina pectoris   2. Essential hypertension   3. Dyslipidemia, goal LDL below 70   4. Carotid artery stenosis, bilateral      PLAN:  In order of problems listed above:  1. ASCAD s/p CABG - not having any  exertional angina.  He has had a few mild episodes of chest discomfort at rest but can exercise up to 5 hours in a day without any symptoms.  I have asked him to notify me if he develops exertional symptoms similar to his prior angina before his CABG.Continue ASA/BB 2. HTN - controlled on BB 3. Dyslipidemia - LDL has been at goal.  Recheck FLP and ALT. 4. Carotid stenosis 1-39% by dopplers  02/2015 - continue ASA  Followup with PA in 6 months and with me in 1 year    Medication Adjustments/Labs and Tests Ordered: Current medicines are reviewed at length with the patient today.  Concerns regarding medicines are outlined above.  Medication changes, Labs and Tests ordered today are listed in the Patient Instructions below.   Lurena Nida, MD  11/02/2015 8:22 AM    [facility] [place] [place], [place], [place]  [address] Phone: [phone]; Fax: [phone]

## 2015-11-02 NOTE — Patient Instructions (Signed)
Medication Instructions:  Your physician recommends that you continue on your current medications as directed. Please refer to the Current Medication list given to you today.   Labwork: TODAY: LFTs, Lipids  Testing/Procedures: None  Follow-Up: Your physician wants you to follow-up in: 6 months with a PA or NP. You will receive a reminder letter in the mail two months in advance. If you don't receive a letter, please call our office to schedule the follow-up appointment.   Your physician wants you to follow-up in: 1 year with Dr. Radford Pax. You will receive a reminder letter in the mail two months in advance. If you don't receive a letter, please call our office to schedule the follow-up appointment.   Any Other Special Instructions Will Be Listed Below (If Applicable).     If you need a refill on your cardiac medications before your next appointment, please call your pharmacy.

## 2015-11-04 ENCOUNTER — Encounter: Payer: Self-pay | Admitting: Cardiology

## 2015-11-04 NOTE — Telephone Encounter (Signed)
This encounter was created in error - please disregard.

## 2015-11-04 NOTE — Telephone Encounter (Signed)
Follow-up    Pt. just returning Katy's call to go over lab results with the pt.

## 2015-11-05 ENCOUNTER — Other Ambulatory Visit: Payer: Self-pay

## 2015-11-05 DIAGNOSIS — E785 Hyperlipidemia, unspecified: Secondary | ICD-10-CM

## 2015-11-05 MED ORDER — EZETIMIBE 10 MG PO TABS: 10.0000 mg | ORAL_TABLET | Freq: Every day | ORAL | Status: DC

## 2016-01-26 NOTE — Progress Notes (Signed)
This encounter was created in error - please disregard.

## 2016-02-08 ENCOUNTER — Other Ambulatory Visit (INDEPENDENT_AMBULATORY_CARE_PROVIDER_SITE_OTHER): Payer: BLUE CROSS/BLUE SHIELD

## 2016-02-08 DIAGNOSIS — E785 Hyperlipidemia, unspecified: Secondary | ICD-10-CM

## 2016-02-08 LAB — HEPATIC FUNCTION PANEL
ALT: 19 U/L (ref 9–46)
AST: 15 U/L (ref 10–35)
Albumin: 4.2 g/dL (ref 3.6–5.1)
Alkaline Phosphatase: 92 U/L (ref 40–115)
BILIRUBIN DIRECT: 0.1 mg/dL (ref <=0.2)
BILIRUBIN TOTAL: 0.6 mg/dL (ref 0.2–1.2)
Indirect Bilirubin: 0.5 mg/dL (ref 0.2–1.2)
Total Protein: 6.6 g/dL (ref 6.1–8.1)

## 2016-02-08 LAB — LIPID PANEL
CHOL/HDL RATIO: 3 ratio (ref <=5.0)
Cholesterol: 127 mg/dL (ref 125–200)
HDL: 43 mg/dL (ref >=40)
LDL Cholesterol: 65 mg/dL (ref <130)
Triglycerides: 95 mg/dL (ref <150)
VLDL: 19 mg/dL (ref <30)

## 2016-03-10 ENCOUNTER — Other Ambulatory Visit: Payer: Self-pay | Admitting: *Deleted

## 2016-03-10 MED ORDER — METOPROLOL TARTRATE 25 MG PO TABS: 12.5000 mg | ORAL_TABLET | Freq: Two times a day (BID) | ORAL | 2 refills | Status: DC

## 2016-04-27 ENCOUNTER — Other Ambulatory Visit: Payer: Self-pay | Admitting: Physician Assistant

## 2016-04-27 DIAGNOSIS — I2510 Atherosclerotic heart disease of native coronary artery without angina pectoris: Secondary | ICD-10-CM

## 2016-07-11 ENCOUNTER — Other Ambulatory Visit: Payer: Self-pay | Admitting: Cardiology

## 2016-07-28 ENCOUNTER — Encounter: Payer: Self-pay | Admitting: Cardiology

## 2016-07-28 DIAGNOSIS — I252 Old myocardial infarction: Secondary | ICD-10-CM | POA: Insufficient documentation

## 2016-07-28 NOTE — Progress Notes (Signed)
Cardiology Office Note    Date:  07/29/2016   ID:  Joel Berger, DOB April 01, 1961, MRN [id number]  PCP:  Enid Skeens., MD  Cardiologist:  Fransico Him, MD   Chief Complaint  Patient presents with  . Coronary Artery Disease  . Hypertension  . Hyperlipidemia    History of Present Illness:  Joel Berger is a 56 y.o. 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 Post op course was notable for AFib but converted to NSR. He was placed on Amiodarone for a short period of time and then this was stopped.  He returns for follow-up. He says that a week ago he had violent nausea/vomiting for 1-2 days and then diarrhea.  This has since resolved. He has not had any anginal chest pain or pressure since his MI.   He works out on the treadmill without any problems.  He denies significant dyspnea. He denies orthopnea, PND, LE edema.  He denies any dizziness, palpitations or syncope.    Past Medical History:  Diagnosis Date  . CAD (coronary artery disease)    a. late presenting Inf MI >> LHC with 3v CAD >> s/p CABG with L-LAD, S-D1, S-PDA.  . Carotid stenosis    a. Carotid US 8/16:  bilat ICA 1-39%  . Diabetes mellitus   . Dyslipidemia, goal LDL below 70 04/29/2015  . History of echocardiogram    a. Echo 8/16:  EF 50-55%, inf AK, mild MR  . Hypertension   . Postoperative atrial fibrillation ([facility])    a. post CABG AF >> converted to NSR with Amiodarone    Past Surgical History:  Procedure Laterality Date  . CARDIAC CATHETERIZATION N/A 03/01/2015   Procedure: Left Heart Cath and Coronary Angiography;  Surgeon: Belva Crome, MD;  Location: [facility];  Service: Cardiovascular;  Laterality: N/A;  . CORONARY ARTERY BYPASS GRAFT N/A 03/03/2015   Procedure: CORONARY ARTERY BYPASS GRAFTING (CABG), ON PUMP, TIMES THREE, USING LEFT INTERNAL MAMMARY  ARTERY, RIGHT GREATER SAPHENOUS VEIN HARVESTED ENDOSCOPICALLY;  Surgeon: Ivin Poot, MD;  Location: [facility];  Service: Open Heart Surgery;  Laterality: N/A;  LIMA TO LAD, SVG TO DIAGONAL, SVG TO PDA  . TEE WITHOUT CARDIOVERSION N/A 03/03/2015   Procedure: TRANSESOPHAGEAL ECHOCARDIOGRAM (TEE);  Surgeon: Ivin Poot, MD;  Location: [facility];  Service: Open Heart Surgery;  Laterality: N/A;    Current Medications: Outpatient Medications Prior to Visit  Medication Sig Dispense Refill  . ACCU-CHEK AVIVA PLUS test strip 1 each by Other route as directed.     Marland Kitchen aspirin EC 325 MG EC tablet Take 1 tablet (325 mg total) by mouth daily.    Marland Kitchen ezetimibe (ZETIA) 10 MG tablet TAKE 1 TABLET (10 MG TOTAL) BY MOUTH DAILY. 30 tablet 6  . glipiZIDE (GLUCOTROL) 10 MG tablet Take 10 mg by mouth Twice daily.     Marland Kitchen lisinopril (PRINIVIL,ZESTRIL) 2.5 MG tablet Take 2.5 mg by mouth Daily.     Marland Kitchen LIVALO 1 MG TABS TAKE 1 TABLET (1 MG TOTAL) BY MOUTH AT BEDTIME. 30 tablet 6  . metFORMIN (GLUCOPHAGE) 1000 MG tablet Take 1,000 mg by mouth Twice daily.     . metoprolol tartrate (LOPRESSOR) 25 MG tablet Take 0.5 tablets (12.5 mg total) by mouth 2 (two) times daily. 90 tablet 2  . rOPINIRole (REQUIP)  0.5 MG tablet Take 0.5 mg by mouth at bedtime as needed (restless legs).     . gabapentin (NEURONTIN) 600 MG tablet Take 600 mg by mouth 2 (two) times daily.      No facility-administered medications prior to visit.      Allergies:   Codeine and Phenergan [promethazine hcl]   Social History   Social History  . Marital status: Single    Spouse name: N/A  . Number of children: N/A  . Years of education: N/A   Social History Main Topics  . Smoking status: Never Smoker  . Smokeless tobacco: Never Used  . Alcohol use Yes     Comment: OCCASIONAL BEER,ONCE WEEK  . Drug use: No  . Sexual activity: Not Asked   Other Topics Concern  . None   Social History Narrative  . None     Family History:  The patient's family  history includes Colon polyps in his paternal uncle; Heart attack in his brother and father; Heart failure in his mother.   ROS:   Please see the history of present illness.    ROS All other systems reviewed and are negative.  No flowsheet data found.     PHYSICAL EXAM:   VS:  BP 130/76   Pulse 66   Ht 5\' 11"  (1.803 m)   Wt 200 lb (90.7 kg)   SpO2 98%   BMI 27.89 kg/m    GEN: Well nourished, well developed, in no acute distress  HEENT: normal  Neck: no JVD, carotid bruits, or masses Cardiac: RRR; no murmurs, rubs, or gallops,no edema.  Intact distal pulses bilaterally.  Respiratory:  clear to auscultation bilaterally, normal work of breathing GI: soft, nontender, nondistended, + BS MS: no deformity or atrophy  Skin: warm and dry, no rash Neuro:  Alert and Oriented x 3, Strength and sensation are intact Psych: euthymic mood, full affect  Wt Readings from Last 3 Encounters:  07/29/16 200 lb (90.7 kg)  11/02/15 211 lb 6.4 oz (95.9 kg)  04/30/15 203 lb 3.2 oz (92.2 kg)      Studies/Labs Reviewed:   EKG:  EKG is  ordered today and showed NSR at 61bpm with no ST changes  Recent Labs: 02/08/2016: ALT 19   Lipid Panel    Component Value Date/Time   CHOL 127 02/08/2016 0845   TRIG 95 02/08/2016 0845   HDL 43 02/08/2016 0845   CHOLHDL 3.0 02/08/2016 0845   VLDL 19 02/08/2016 0845   LDLCALC 65 02/08/2016 0845    Additional studies/ records that were reviewed today include:  none    ASSESSMENT:    1. Coronary artery disease involving native coronary artery of native heart without angina pectoris   2. Essential hypertension   3. Postoperative atrial fibrillation ([facility])   4. Bilateral carotid artery stenosis   5. Dyslipidemia, goal LDL below 70      PLAN:  In order of problems listed above:  1. ASCAD s/p CABG.  He has not had any anginal chest pain and works out on his elliptical with no problems. Continue ASA and decrease to 81mg  and continue statin.  2. HTN  - BP controlled on current meds.  Continue ACE I/BB. 3. Post op atrial fibrillation maintaining NSR with no reoccurrence.  Continue BB 4. Bilateral carotid artery stenosis ( 1-39% by dopplers 02/2015).  I will repeat dopplers 02/2017.  Continue ASA and statin/Zetia. Check FLP and ALT.    Medication Adjustments/Labs and Tests Ordered: Current medicines  are reviewed at length with the patient today.  Concerns regarding medicines are outlined above.  Medication changes, Labs and Tests ordered today are listed in the Patient Instructions below.  There are no Patient Instructions on file for this visit.   Signed, Fransico Him, MD  07/29/2016 8:32 AM    [facility], [place], [place]  [address] Phone: [phone]; Fax: [phone]

## 2016-07-29 ENCOUNTER — Ambulatory Visit (INDEPENDENT_AMBULATORY_CARE_PROVIDER_SITE_OTHER): Payer: BLUE CROSS/BLUE SHIELD | Admitting: Cardiology

## 2016-07-29 ENCOUNTER — Encounter: Payer: Self-pay | Admitting: Cardiology

## 2016-07-29 VITALS — BP 130/76 | HR 66 | Ht 71.0 in | Wt 200.0 lb

## 2016-07-29 DIAGNOSIS — I4891 Unspecified atrial fibrillation: Secondary | ICD-10-CM

## 2016-07-29 DIAGNOSIS — I2510 Atherosclerotic heart disease of native coronary artery without angina pectoris: Secondary | ICD-10-CM

## 2016-07-29 DIAGNOSIS — I10 Essential (primary) hypertension: Secondary | ICD-10-CM | POA: Diagnosis not present

## 2016-07-29 DIAGNOSIS — I6523 Occlusion and stenosis of bilateral carotid arteries: Secondary | ICD-10-CM

## 2016-07-29 DIAGNOSIS — I9789 Other postprocedural complications and disorders of the circulatory system, not elsewhere classified: Secondary | ICD-10-CM

## 2016-07-29 DIAGNOSIS — E785 Hyperlipidemia, unspecified: Secondary | ICD-10-CM

## 2016-07-29 NOTE — Patient Instructions (Signed)
Medication Instructions:  Your physician recommends that you continue on your current medications as directed. Please refer to the Current Medication list given to you today.   Labwork: Your physician recommends that you have lab work today: bmet/lft/lipid   Testing/Procedures: Your physician has requested that you have a carotid duplex in 8 months. This test is an ultrasound of the carotid arteries in your neck. It looks at blood flow through these arteries that supply the brain with blood. Allow one hour for this exam. There are no restrictions or special instructions.    Follow-Up: Your physician wants you to follow-up in: 1 year with Dr. Radford Pax.  You will receive a reminder letter in the mail two months in advance. If you don't receive a letter, please call our office to schedule the follow-up appointment.   Any Other Special Instructions Will Be Listed Below (If Applicable).     If you need a refill on your cardiac medications before your next appointment, please call your pharmacy.

## 2016-07-30 LAB — HEPATIC FUNCTION PANEL
ALBUMIN: 4.2 g/dL (ref 3.5–5.5)
ALK PHOS: 93 IU/L (ref 39–117)
ALT: 75 IU/L — ABNORMAL HIGH (ref 0–44)
AST: 45 IU/L — AB (ref 0–40)
Bilirubin Total: 0.8 mg/dL (ref 0.0–1.2)
Bilirubin, Direct: 0.31 mg/dL (ref 0.00–0.40)
Total Protein: 6.5 g/dL (ref 6.0–8.5)

## 2016-07-30 LAB — BASIC METABOLIC PANEL
BUN/Creatinine Ratio: 16 (ref 9–20)
BUN: 11 mg/dL (ref 6–24)
CO2: 25 mmol/L (ref 18–29)
CREATININE: 0.67 mg/dL — AB (ref 0.76–1.27)
Calcium: 9.3 mg/dL (ref 8.7–10.2)
Chloride: 99 mmol/L (ref 96–106)
GFR calc Af Amer: 125 mL/min/{1.73_m2} (ref >59)
GFR, EST NON AFRICAN AMERICAN: 108 mL/min/{1.73_m2} (ref >59)
Glucose: 174 mg/dL — ABNORMAL HIGH (ref 65–99)
Potassium: 3.5 mmol/L (ref 3.5–5.2)
SODIUM: 142 mmol/L (ref 134–144)

## 2016-08-03 IMAGING — CR DG CHEST 2V
2 series · 2 of 2 positions shown · non-contrast
Comparison: None.

CLINICAL DATA: Intermittent chest pain for 2 days.

EXAM:
CHEST  2 VIEW

[w chest pa]
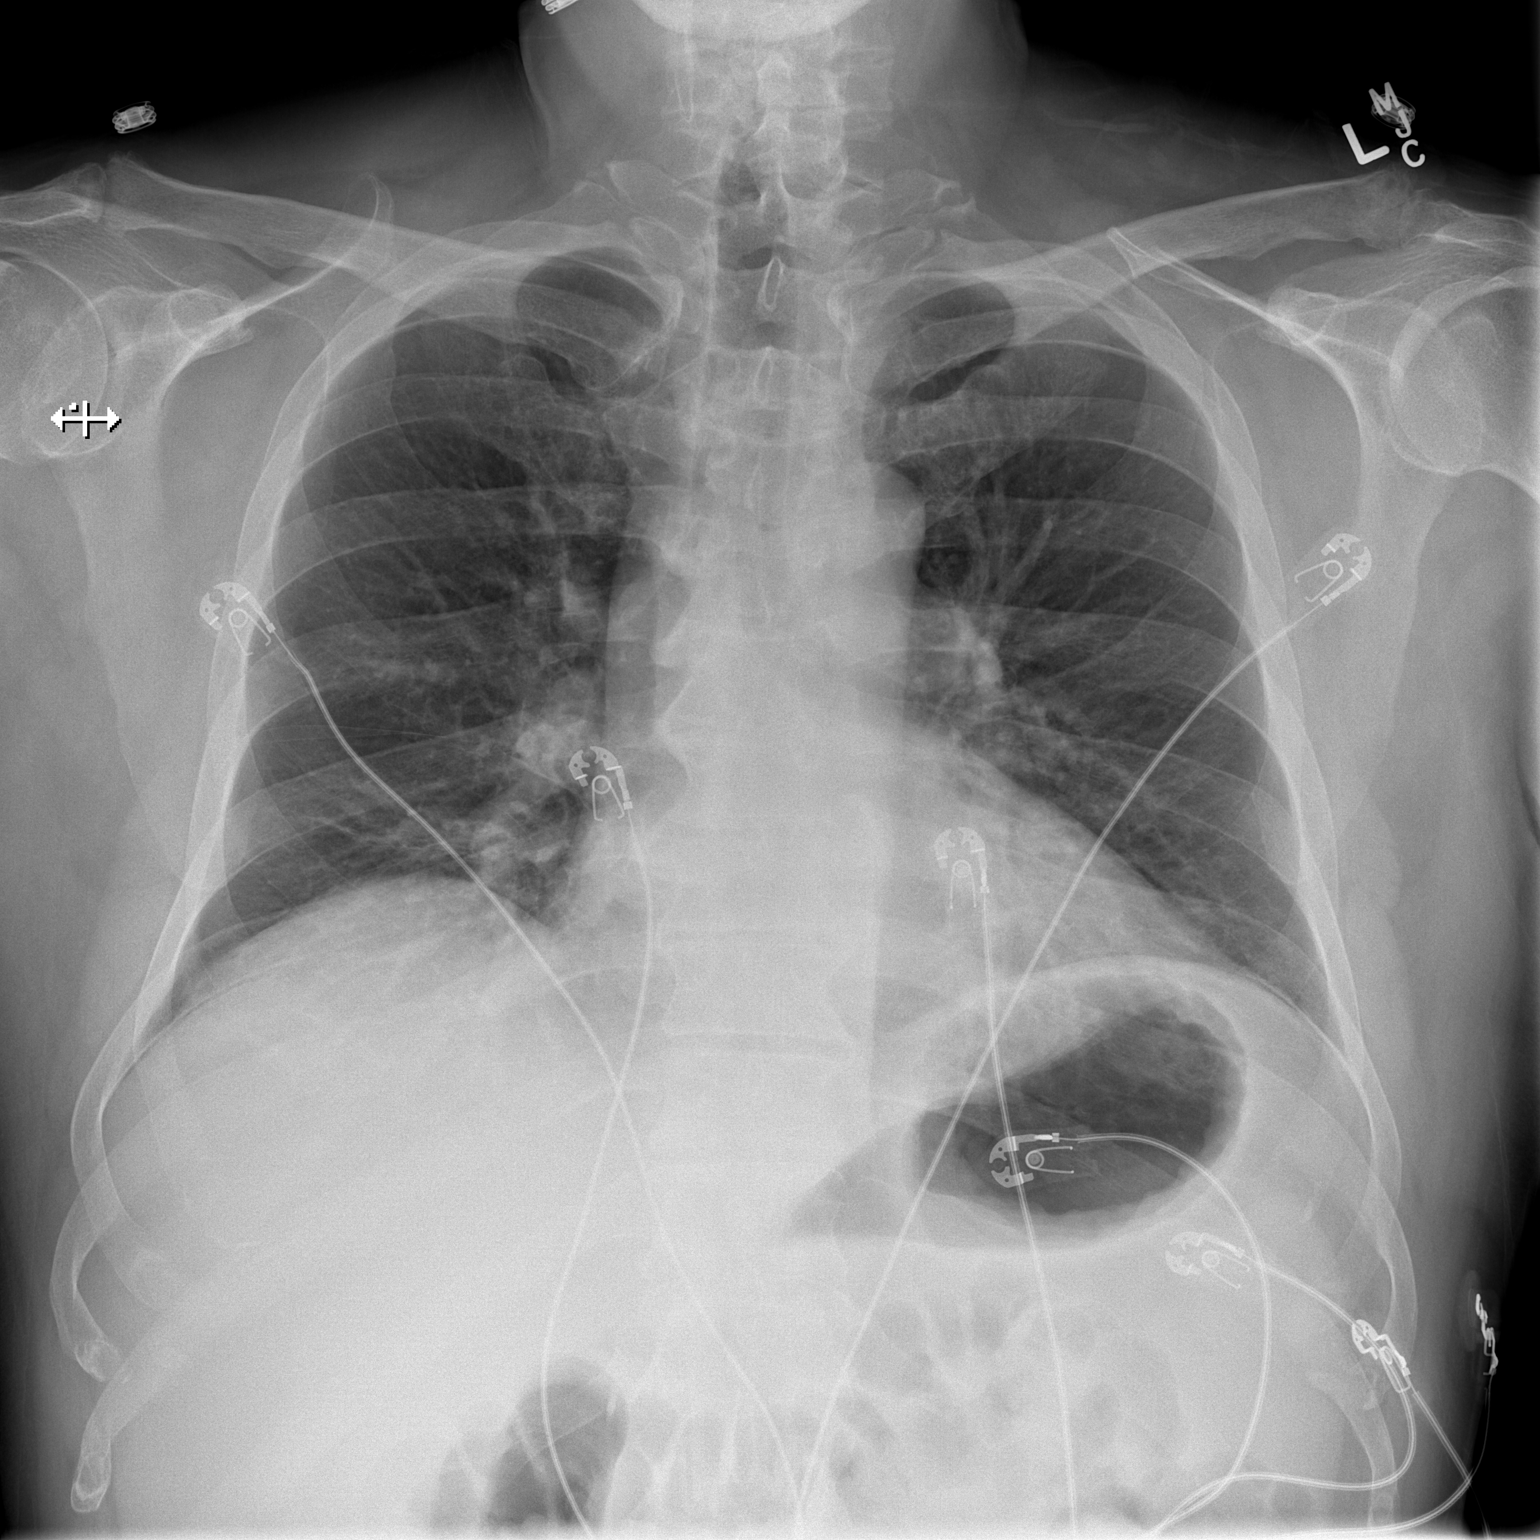

[w chest lat]
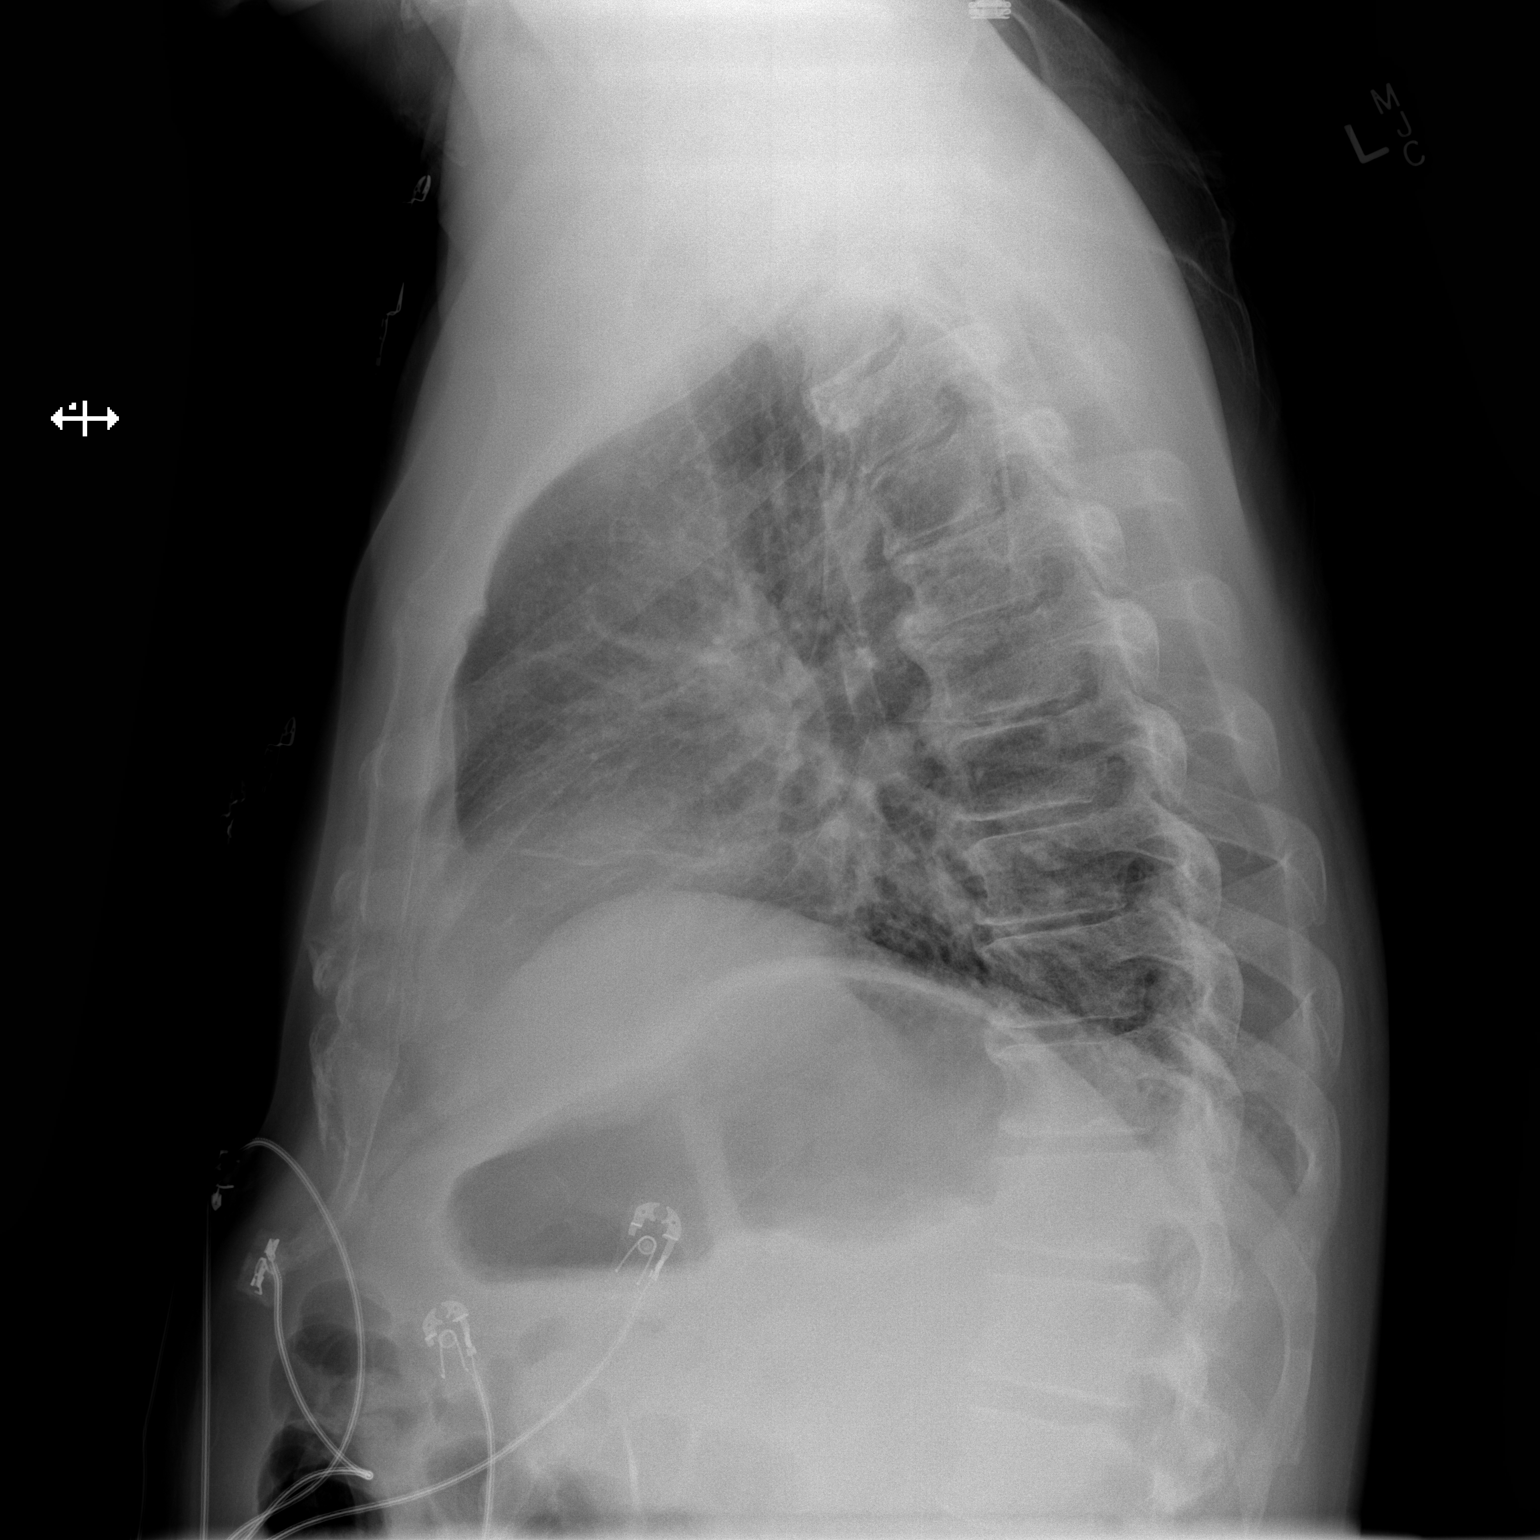

[2 of 2 positions shown; findings below may reference images not displayed]

FINDINGS: Lung volumes are low. There is bibasilar atelectasis. The
cardiomediastinal contours are normal. The lungs are clear.
Pulmonary vasculature is normal. No consolidation, pleural effusion,
or pneumothorax. No acute osseous abnormalities are seen. There is
degenerative change in the thoracic spine.
IMPRESSION: Hypoventilatory chest with bibasilar atelectasis.

## 2016-08-06 IMAGING — CR DG CHEST 1V PORT
1 series · 1 of 1 positions shown · non-contrast
Comparison: Portable chest x-ray March 03, 2015

CLINICAL DATA: Status post CABG yesterday

EXAM:
PORTABLE CHEST - 1 VIEW

[AP]
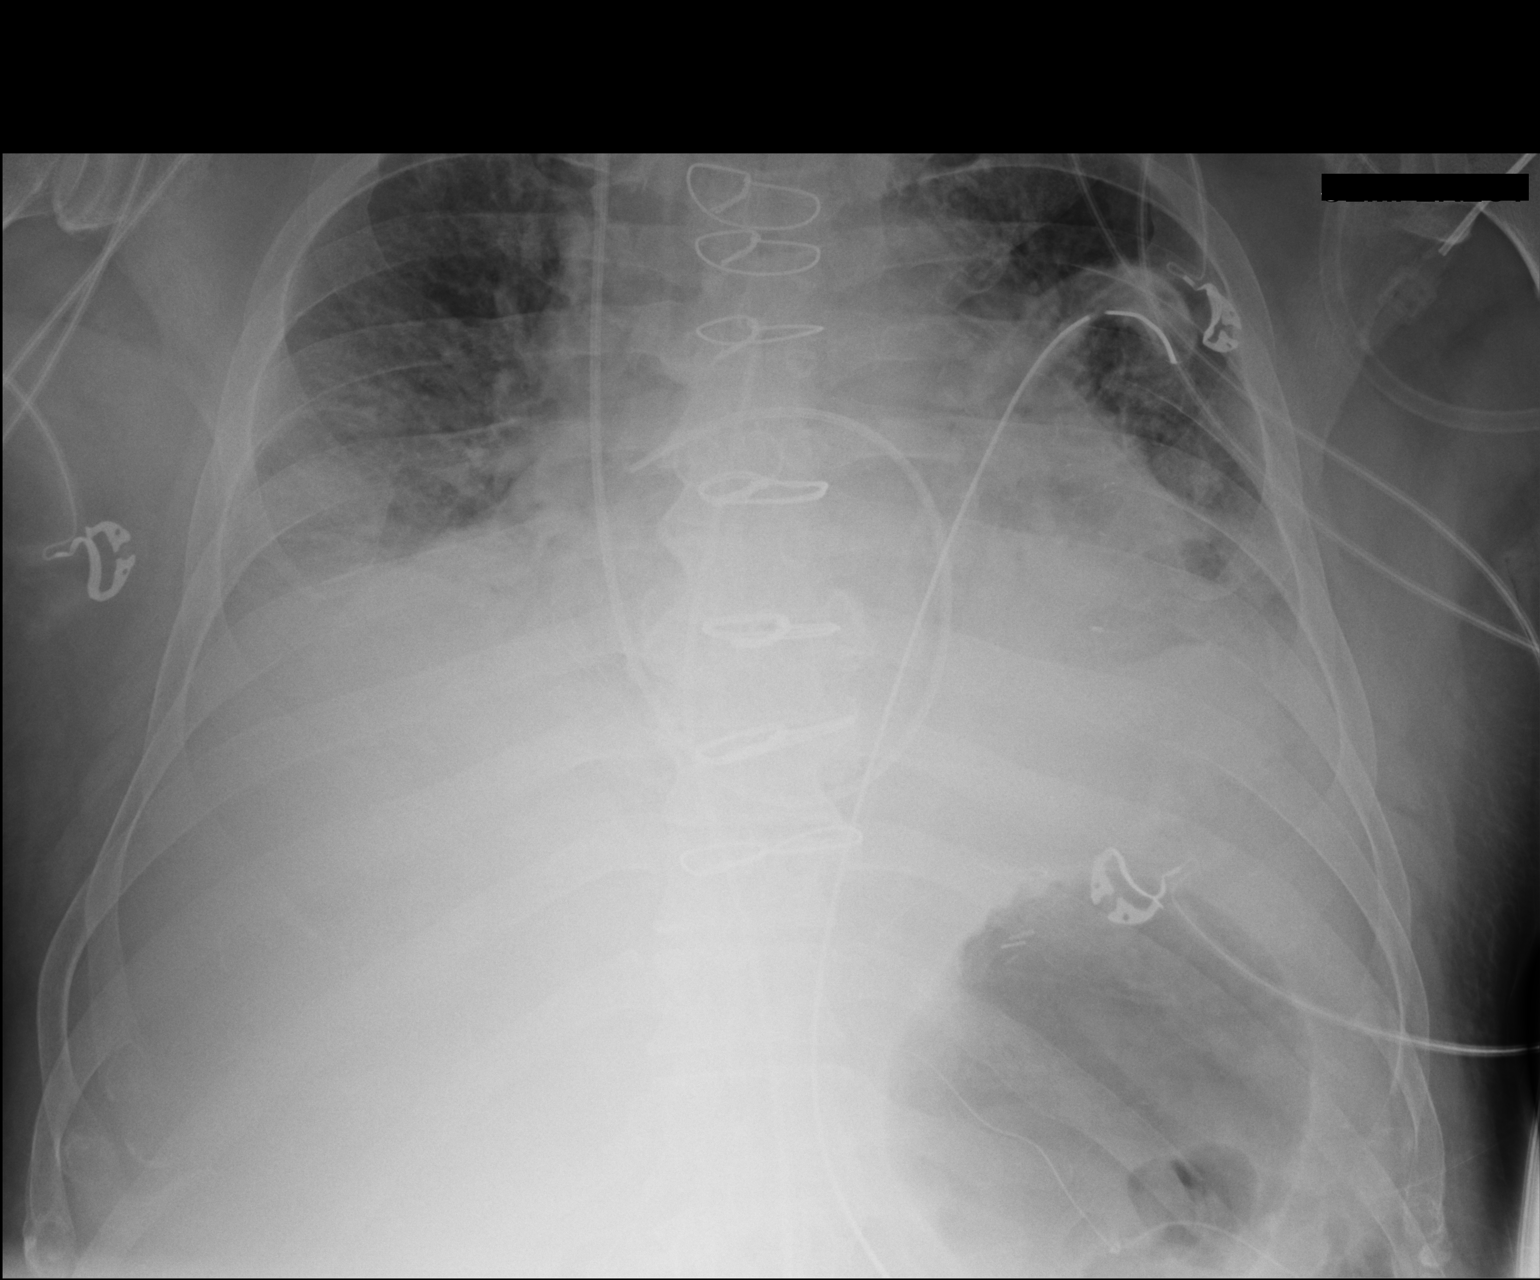

[1 of 1 positions shown; findings below may reference images not displayed]

FINDINGS: The lungs are hypoinflated. The interstitial markings are more
prominent largely due to crowding. There has been interval
extubation of the trachea and of the esophagus. The cardiac
silhouette remains enlarged. The Swan-Ganz catheter tip overlies the
distal right main pulmonary artery. The left-sided chest tube is in
stable position between the posterior aspects of the fourth and
fifth ribs. There is persistent left lower lobe atelectasis and
small left pleural effusion. The mediastinal drain is unchanged in
position. There is 7 intact sternal wires.
IMPRESSION: Bilateral hypoinflation since extubation. Stable left lower lobe
atelectasis trace left pleural effusion. Mild pulmonary interstitial
edema. The remaining support tubes are in reasonable position.

## 2016-08-08 IMAGING — CR DG CHEST 2V
2 series · 2 of 2 positions shown · non-contrast
Comparison: Portable chest x-ray March 05, 2015

CLINICAL DATA: Status post CABG, no chest complaints this morning

EXAM:
CHEST  2 VIEW

[chest pa]
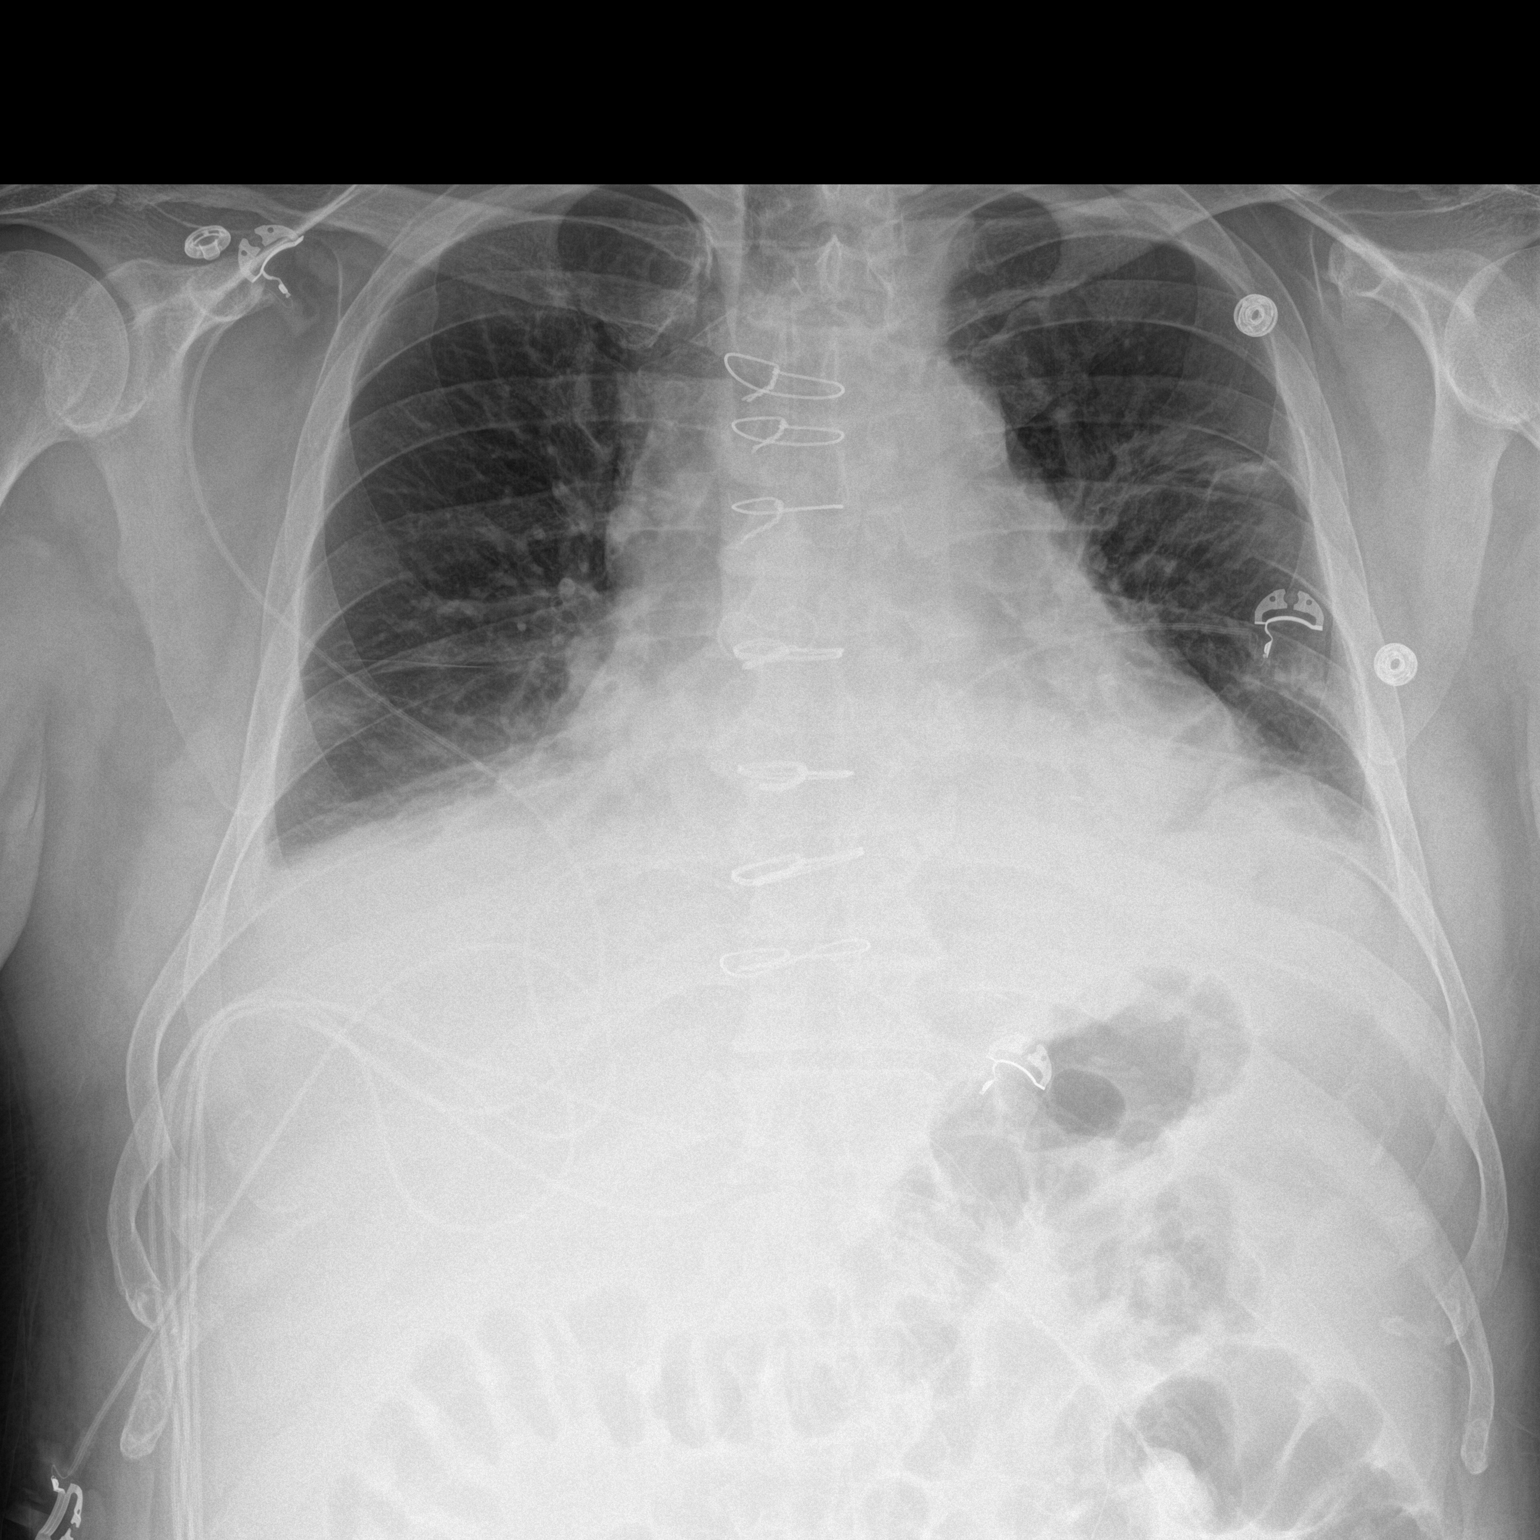

[chest lat]
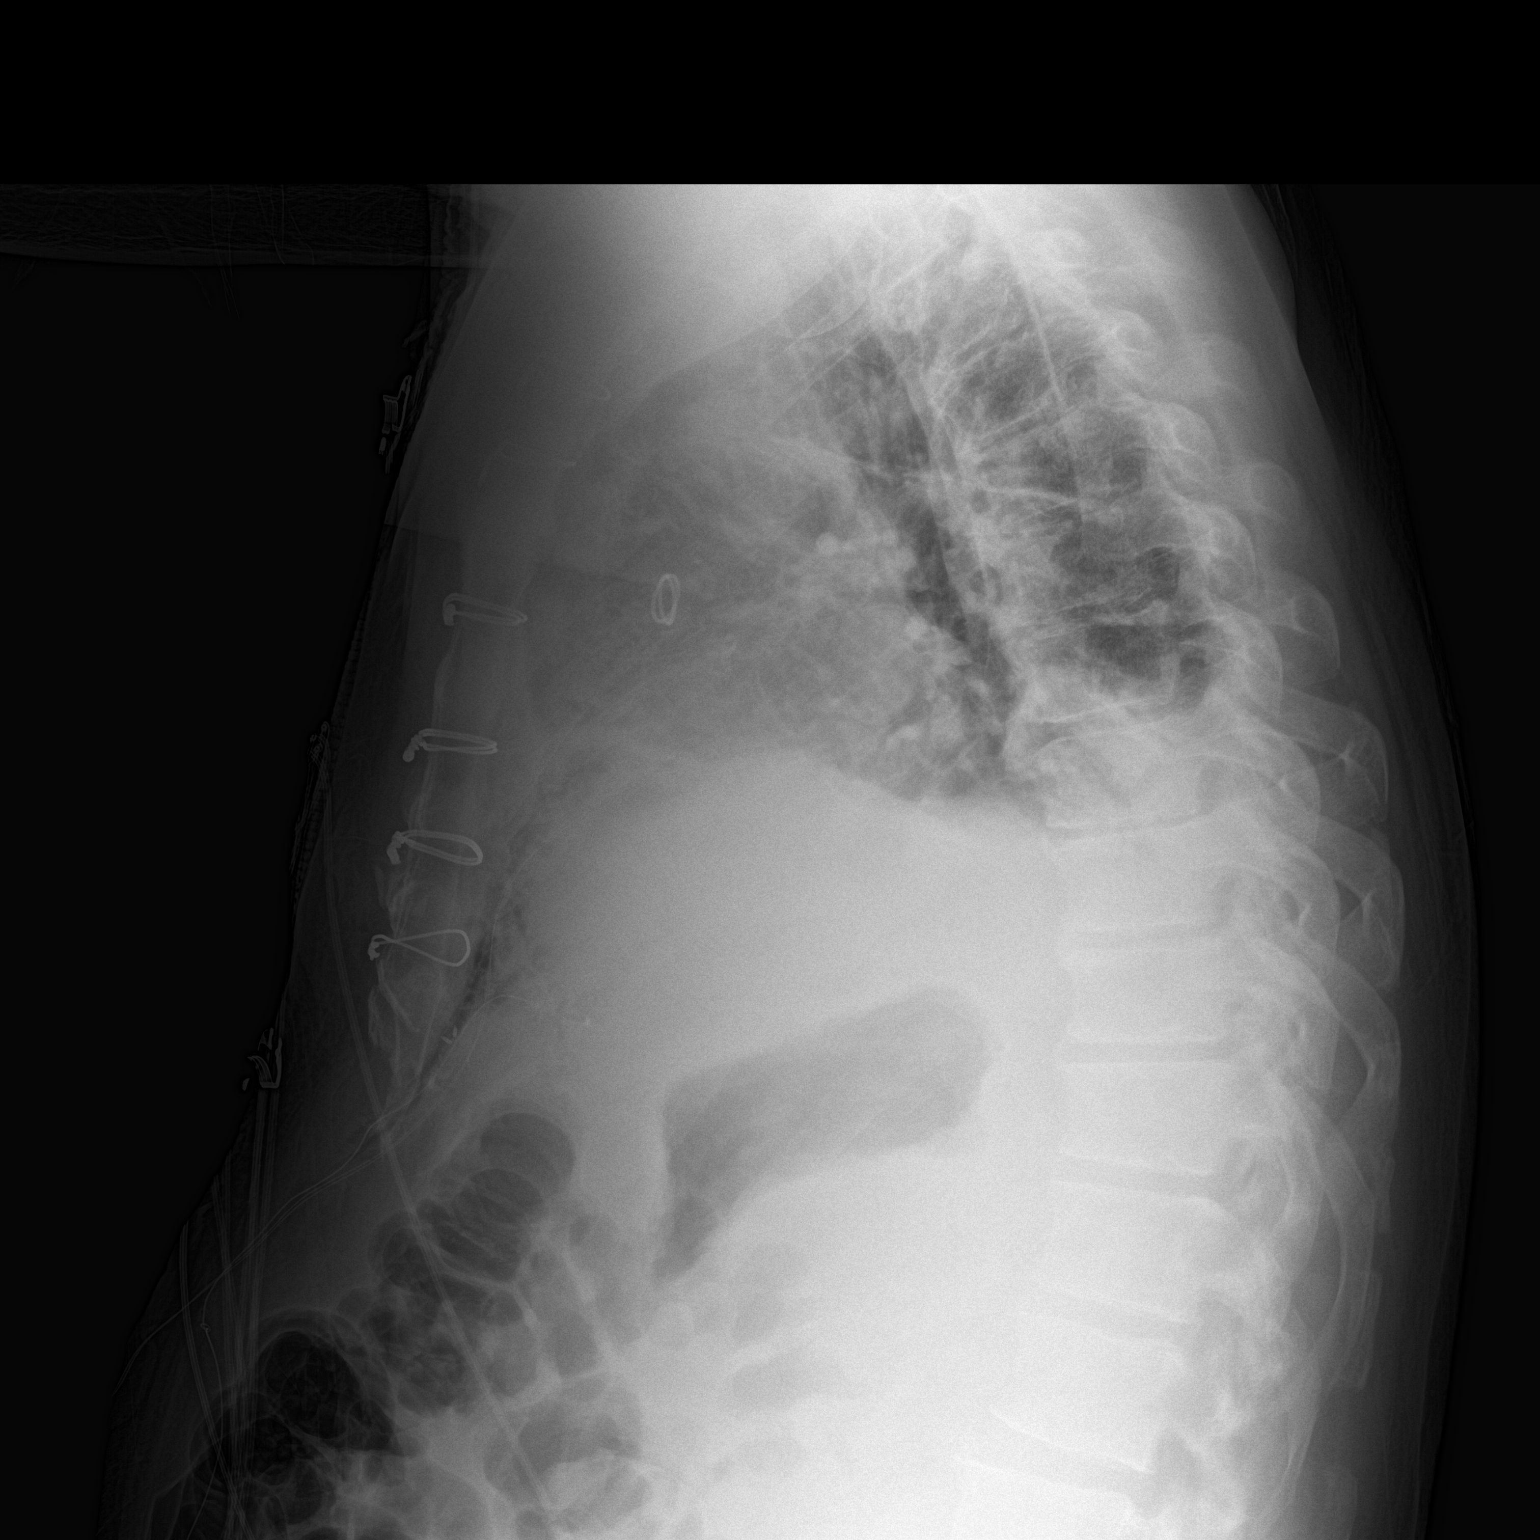

[2 of 2 positions shown; findings below may reference images not displayed]

FINDINGS: The lungs are hypoinflated. There are bibasilar subsegmental
atelectasis and small bilateral pleural effusions. The pulmonary
interstitial markings are less prominent. The cardiac silhouette
remains enlarged. There are 6 intact sternal wires. The right
internal jugular Cordis sheath has been removed.
IMPRESSION: Bilateral hypoinflation. Improving pulmonary interstitial edema and
bilateral subsegmental atelectasis. Small bilateral pleural
effusions remain.

## 2017-01-31 ENCOUNTER — Other Ambulatory Visit: Payer: Self-pay | Admitting: Cardiology

## 2017-03-27 ENCOUNTER — Other Ambulatory Visit: Payer: Self-pay | Admitting: Cardiology

## 2017-06-19 ENCOUNTER — Other Ambulatory Visit: Payer: Self-pay

## 2017-06-19 MED ORDER — METOPROLOL TARTRATE 25 MG PO TABS: 12.5000 mg | ORAL_TABLET | Freq: Two times a day (BID) | ORAL | 0 refills | Status: DC

## 2017-06-23 ENCOUNTER — Other Ambulatory Visit: Payer: Self-pay | Admitting: Cardiology

## 2017-07-23 ENCOUNTER — Other Ambulatory Visit: Payer: Self-pay | Admitting: Cardiology

## 2017-07-27 ENCOUNTER — Other Ambulatory Visit: Payer: Self-pay | Admitting: Cardiology

## 2017-08-02 ENCOUNTER — Other Ambulatory Visit: Payer: Self-pay | Admitting: Cardiology

## 2017-08-24 ENCOUNTER — Other Ambulatory Visit: Payer: Self-pay | Admitting: Cardiology

## 2017-08-25 ENCOUNTER — Ambulatory Visit: Payer: BLUE CROSS/BLUE SHIELD | Admitting: Cardiology

## 2017-08-25 ENCOUNTER — Encounter: Payer: Self-pay | Admitting: Cardiology

## 2017-08-25 VITALS — BP 144/80 | HR 61 | Ht 71.0 in | Wt 221.8 lb

## 2017-08-25 DIAGNOSIS — E785 Hyperlipidemia, unspecified: Secondary | ICD-10-CM | POA: Diagnosis not present

## 2017-08-25 DIAGNOSIS — I6523 Occlusion and stenosis of bilateral carotid arteries: Secondary | ICD-10-CM | POA: Diagnosis not present

## 2017-08-25 DIAGNOSIS — I2510 Atherosclerotic heart disease of native coronary artery without angina pectoris: Secondary | ICD-10-CM | POA: Diagnosis not present

## 2017-08-25 DIAGNOSIS — I10 Essential (primary) hypertension: Secondary | ICD-10-CM

## 2017-08-25 LAB — HEPATIC FUNCTION PANEL
ALT: 18 IU/L (ref 0–44)
AST: 15 IU/L (ref 0–40)
Albumin: 4.6 g/dL (ref 3.5–5.5)
Alkaline Phosphatase: 80 IU/L (ref 39–117)
BILIRUBIN TOTAL: 0.4 mg/dL (ref 0.0–1.2)
Bilirubin, Direct: 0.14 mg/dL (ref 0.00–0.40)
Total Protein: 6.7 g/dL (ref 6.0–8.5)

## 2017-08-25 LAB — BASIC METABOLIC PANEL
BUN/Creatinine Ratio: 26 — ABNORMAL HIGH (ref 9–20)
BUN: 19 mg/dL (ref 6–24)
CALCIUM: 9.1 mg/dL (ref 8.7–10.2)
CHLORIDE: 101 mmol/L (ref 96–106)
CO2: 26 mmol/L (ref 20–29)
Creatinine, Ser: 0.73 mg/dL — ABNORMAL LOW (ref 0.76–1.27)
GFR calc non Af Amer: 104 mL/min/{1.73_m2} (ref >59)
GFR, EST AFRICAN AMERICAN: 120 mL/min/{1.73_m2} (ref >59)
Glucose: 159 mg/dL — ABNORMAL HIGH (ref 65–99)
POTASSIUM: 4.4 mmol/L (ref 3.5–5.2)
SODIUM: 142 mmol/L (ref 134–144)

## 2017-08-25 LAB — LIPID PANEL
Chol/HDL Ratio: 3.6 ratio (ref 0.0–5.0)
Cholesterol, Total: 160 mg/dL (ref 100–199)
HDL: 45 mg/dL (ref >39)
LDL CALC: 101 mg/dL — AB (ref 0–99)
Triglycerides: 72 mg/dL (ref 0–149)
VLDL CHOLESTEROL CAL: 14 mg/dL (ref 5–40)

## 2017-08-25 MED ORDER — METOPROLOL TARTRATE 25 MG PO TABS: 12.5000 mg | ORAL_TABLET | Freq: Two times a day (BID) | ORAL | 11 refills | Status: DC

## 2017-08-25 MED ORDER — ASPIRIN EC 81 MG PO TBEC: 81.0000 mg | DELAYED_RELEASE_TABLET | Freq: Every day | ORAL | 3 refills | Status: DC

## 2017-08-25 NOTE — Patient Instructions (Signed)
Medication Instructions:  Your physician has recommended you make the following change in your medication:  DECREASE: aspirin to 81 mg once a day   Labwork: Today for kidney function, liver function, and fasting lipids  Testing/Procedures: None ordered   Follow-Up: Your physician wants you to follow-up in: 1 year with Dr. Radford Pax. You will receive a reminder letter in the mail two months in advance. If you don't receive a letter, please call our office to schedule the follow-up appointment.  Any Other Special Instructions Will Be Listed Below (If Applicable).  Thank you for choosing [facility], RN  [phone]   If you need a refill on your cardiac medications before your next appointment, please call your pharmacy.

## 2017-08-25 NOTE — Progress Notes (Signed)
Cardiology Office Note:    Date:  08/25/2017   ID:  Joel Berger, DOB 20-Feb-1961, MRN [id number]  PCP:  Enid Skeens., MD  Cardiologist:  No primary care provider on file.    Referring MD: Enid Skeens., MD   Chief Complaint  Patient presents with  . Hypertension  . Coronary Artery Disease  . Hyperlipidemia    History of Present Illness:    Joel Berger is a 57 y.o. 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 Post op course was notable for AFib but converted to NSR.  He is here today for followup and is doing well.  He denies any chest pain or pressure, SOB, DOE, PND, orthopnea, LE edema, dizziness, palpitations or syncope. He is compliant with his meds and is tolerating meds with no SE.      Past Medical History:  Diagnosis Date  . CAD (coronary artery disease)    a. late presenting Inf MI >> LHC with 3v CAD >> s/p CABG with L-LAD, S-D1, S-PDA.  . Carotid stenosis    a. Carotid US 8/16:  bilat ICA 1-39%  . Diabetes mellitus   . Dyslipidemia, goal LDL below 70 04/29/2015  . History of echocardiogram    a. Echo 8/16:  EF 50-55%, inf AK, mild MR  . Hypertension   . Postoperative atrial fibrillation (HCC)    a. post CABG AF >> converted to NSR with Amiodarone    Past Surgical History:  Procedure Laterality Date  . CARDIAC CATHETERIZATION N/A 03/01/2015   Procedure: Left Heart Cath and Coronary Angiography;  Surgeon: Belva Crome, MD;  Location: [place] [facility];  Service: Cardiovascular;  Laterality: N/A;  . CORONARY ARTERY BYPASS GRAFT N/A 03/03/2015   Procedure: CORONARY ARTERY BYPASS GRAFTING (CABG), ON PUMP, TIMES THREE, USING LEFT INTERNAL MAMMARY ARTERY, RIGHT GREATER SAPHENOUS VEIN HARVESTED ENDOSCOPICALLY;  Surgeon: Ivin Poot, MD;  Location: [place];  Service: Open Heart Surgery;  Laterality: N/A;  LIMA TO LAD, SVG  TO DIAGONAL, SVG TO PDA  . TEE WITHOUT CARDIOVERSION N/A 03/03/2015   Procedure: TRANSESOPHAGEAL ECHOCARDIOGRAM (TEE);  Surgeon: Ivin Poot, MD;  Location: [place];  Service: Open Heart Surgery;  Laterality: N/A;    Current Medications: Current Meds  Medication Sig  . ACCU-CHEK AVIVA PLUS test strip 1 each by Other route as directed.   Marland Kitchen aspirin EC 325 MG EC tablet Take 1 tablet (325 mg total) by mouth daily.  Marland Kitchen ezetimibe (ZETIA) 10 MG tablet Take 1 tablet (10 mg total) by mouth daily. Please keep upcoming appt for future refills. Thank you  . glipiZIDE (GLUCOTROL) 10 MG tablet Take 10 mg by mouth Twice daily.   Marland Kitchen JANUVIA 50 MG tablet Take 1 tablet by mouth daily.  Marland Kitchen lisinopril (PRINIVIL,ZESTRIL) 2.5 MG tablet Take 2.5 mg by mouth Daily.   Marland Kitchen LIVALO 1 MG TABS TAKE 1 TABLET (1 MG TOTAL) BY MOUTH AT BEDTIME.  . metFORMIN (GLUCOPHAGE) 1000 MG tablet Take 1,000 mg by mouth Twice daily.   . metoprolol tartrate (LOPRESSOR) 25 MG tablet Take 0.5 tablets (12.5 mg total) by mouth 2 (two) times daily. Please keep upcoming appointment for further refills     Allergies:   Codeine and Phenergan [promethazine hcl]   Social History   Socioeconomic History  . Marital status: Single  Spouse name: None  . Number of children: None  . Years of education: None  . Highest education level: None  Social Needs  . Financial resource strain: None  . Food insecurity - worry: None  . Food insecurity - inability: None  . Transportation needs - medical: None  . Transportation needs - non-medical: None  Occupational History  . None  Tobacco Use  . Smoking status: Never Smoker  . Smokeless tobacco: Never Used  Substance and Sexual Activity  . Alcohol use: Yes    Comment: OCCASIONAL BEER,ONCE WEEK  . Drug use: No  . Sexual activity: None  Other Topics Concern  . None  Social History Narrative  . None     Family History: The patient's family history includes Colon polyps in his paternal uncle;  Heart attack in his brother and father; Heart failure in his mother. There is no history of Colon cancer.  ROS:   Please see the history of present illness.    ROS  All other systems reviewed and negative.   EKGs/Labs/Other Studies Reviewed:    The following studies were reviewed today: none  EKG:  EKG is  ordered today.  The ekg ordered today demonstrates NSR at 61bp with no ST changes  Recent Labs: No results found for requested labs within last 8760 hours.   Recent Lipid Panel    Component Value Date/Time   CHOL 127 02/08/2016 0845   TRIG 95 02/08/2016 0845   HDL 43 02/08/2016 0845   CHOLHDL 3.0 02/08/2016 0845   VLDL 19 02/08/2016 0845   LDLCALC 65 02/08/2016 0845    Physical Exam:    VS:  BP (!) 144/80   Pulse 61   Ht 5\' 11"  (1.803 m)   Wt 221 lb 12.8 oz (100.6 kg)   BMI 30.93 kg/m      Wt Readings from Last 3 Encounters:  08/25/17 221 lb 12.8 oz (100.6 kg)  07/29/16 200 lb (90.7 kg)  11/02/15 211 lb 6.4 oz (95.9 kg)     GEN:  Well nourished, well developed in no acute distress HEENT: Normal NECK: No JVD; No carotid bruits LYMPHATICS: No lymphadenopathy CARDIAC: RRR, no murmurs, rubs, gallops RESPIRATORY:  Clear to auscultation without rales, wheezing or rhonchi  ABDOMEN: Soft, non-tender, non-distended MUSCULOSKELETAL:  No edema; No deformity  SKIN: Warm and dry NEUROLOGIC:  Alert and oriented x 3 PSYCHIATRIC:  Normal affect   ASSESSMENT:    1. Coronary artery disease involving native coronary artery of native heart without angina pectoris   2. Essential hypertension   3. Bilateral carotid artery stenosis   4. Dyslipidemia, goal LDL below 70    PLAN:    In order of problems listed above:  1.  ASCAD -status post inferior STEMI with cath showing occluded distal RCA, 90% LAD and 70% D1 stenosis with EF 45-50%.  He underwent CABG with LIMA-LAD, SVG-D1 and SVG-PDA.  He denies any anginal symptoms.  He will continue on aspirin but I have instructed  him to decrease it to 81 mg daily.  He will also continue on metoprolol 12.5 mg twice daily.  He will continue on statin as well.  2.  HTN-blood pressure fairly controlled on exam today.  He will continue on metoprolol 12.5 mg twice daily and lisinopril 2.5 mg daily.  I will check a BMET.  3.  Bilateral carotid artery stenosis -Dopplers 03-02-2015 showed 1-39% stenosis.  He will continue on ASA and statin.   4.  Hyperlipidemia with  LDL goal less than 70.  He will continue on Livalo 1 mg daily.I will check FLP and ALT.   Medication Adjustments/Labs and Tests Ordered: Current medicines are reviewed at length with the patient today.  Concerns regarding medicines are outlined above.  Orders Placed This Encounter  Procedures  . EKG 12-Lead   No orders of the defined types were placed in this encounter.   Signed, Fransico Him, MD  08/25/2017 8:06 AM    [place]

## 2017-08-28 ENCOUNTER — Telehealth: Payer: Self-pay

## 2017-08-28 DIAGNOSIS — E7841 Elevated Lipoprotein(a): Secondary | ICD-10-CM

## 2017-08-28 NOTE — Telephone Encounter (Signed)
Notes recorded by Teressa Senter, RN on 08/28/2017 at 12:57 PM EST Patient made aware of lab results. Patient stated he has not been consistent with taking Livalo everyday due to muscle aches. Offered patient an appt at the lipid clinic to discuss other alternatives to statins. Patient refused and stated he would be compliant with patient Livalo daily as prescribed. Patient is scheduled for repeat labs on 10/23/17. Patient in agreement with plan and thankful for the call  Notes recorded by Sueanne Margarita, MD on 08/27/2017 at 1:34 PM EST LDL not at goal, increase Livalo to 2mg  daily and repeat FLp and ALT in 6 weeks

## 2017-09-04 ENCOUNTER — Other Ambulatory Visit: Payer: Self-pay | Admitting: Cardiology

## 2017-10-23 ENCOUNTER — Other Ambulatory Visit: Payer: BLUE CROSS/BLUE SHIELD

## 2017-10-26 ENCOUNTER — Other Ambulatory Visit: Payer: BLUE CROSS/BLUE SHIELD | Admitting: *Deleted

## 2017-10-26 DIAGNOSIS — E7841 Elevated Lipoprotein(a): Secondary | ICD-10-CM

## 2017-10-26 LAB — LIPID PANEL
CHOL/HDL RATIO: 3.5 ratio (ref 0.0–5.0)
Cholesterol, Total: 149 mg/dL (ref 100–199)
HDL: 42 mg/dL (ref >39)
LDL Calculated: 92 mg/dL (ref 0–99)
TRIGLYCERIDES: 75 mg/dL (ref 0–149)
VLDL Cholesterol Cal: 15 mg/dL (ref 5–40)

## 2017-10-26 LAB — ALT: ALT: 15 IU/L (ref 0–44)

## 2017-11-29 NOTE — Progress Notes (Signed)
Patient ID: Joel Berger                 DOB: 1961-01-18                    MRN: [id number]     HPI: Joel Berger is a 57 y.o. male patient of Dr. Radford Pax that presents today for lipid evaluation.  PMH includes DM2, HTN, ASCAD with inferior STEMI 02/2015 (cath with occluded distal RCA, 90% proximal LAD and 70% D1 stenosis, EF was 45-50%) s/p CABG with L-LAD, S-D1, S-PDA. He has a history of difficulty taking statin medications due to muscle aching. He recent has been taking livalo 1mg  for 1 week then stopping for a week or two then restarting for a few days. He was advised to start rosuvastatin, but pt declined and was scheduled for discussion of cholesterol.  He presents for discussion of cholesterol and PCSK9i therapy.  He states that he can hardly walk when he develops muscle pains on the statin medications. He states he restarted livalo, but experienced pain and stopped again. He is not crazy about the idea of injections, but knows that he needs to do something for his cholesterol.   [facility]   Risk Factors: CAD s/p CABG, HTN Goals: LDL <70, nonHDL <100  Current Medications: livalo 1mg  daily for 1 week then nothing for a week, ezetimibe 10mg  daily Intolerances: livalo 1mg  daily (muscle aching), atorvastatin 80mg , 20mg  (muscle aching), pravastatin 20mg  (muscle aching), simvastatin 40mg  daily (muscle aching)  Diet: He eats out about half the time. He eats egg and cheese biscuit on way to work. Sit down resturants for lunch. Healthy at dinner. He eats most chicken and some beef. He usually bakes meals. He drinks water and coffee mostly with occasional iced tea and diet coke.   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   Family History: Colon polyps in his paternal uncle; Heart attack in his brother and father; Heart failure in his mother. There is no history of Colon cancer.  Social History: never smoker, beer once  weekly  Labs: 10/26/17:  TC 149, TG 75, HDL 42, LDL 92, nonHDL 107 (livalo 1mg  daily for 1 week then stop, ezetimibe 10mg  daily)  Past Medical History:  Diagnosis Date  . CAD (coronary artery disease)    a. late presenting Inf MI >> LHC with 3v CAD >> s/p CABG with L-LAD, S-D1, S-PDA.  . Carotid stenosis    a. Carotid US 8/16:  bilat ICA 1-39%  . Diabetes mellitus   . Dyslipidemia, goal LDL below 70 04/29/2015  . History of echocardiogram    a. Echo 8/16:  EF 50-55%, inf AK, mild MR  . Hypertension   . Postoperative atrial fibrillation (HCC)    a. post CABG AF >> converted to NSR with Amiodarone    Current Outpatient Medications on File Prior to Visit  Medication Sig Dispense Refill  . ACCU-CHEK AVIVA PLUS test strip 1 each by Other route as directed.     Marland Kitchen aspirin EC 81 MG tablet Take 1 tablet (81 mg total) by mouth daily. 90 tablet 3  . ezetimibe (ZETIA) 10 MG tablet Take 1 tablet (10 mg total) by mouth daily. 30 tablet 11  . glipiZIDE (GLUCOTROL) 10 MG tablet Take 10 mg by mouth Twice daily.     Marland Kitchen JANUVIA 50 MG tablet Take 1 tablet by mouth daily.    Marland Kitchen  lisinopril (PRINIVIL,ZESTRIL) 2.5 MG tablet Take 2.5 mg by mouth Daily.     Marland Kitchen LIVALO 1 MG TABS TAKE 1 TABLET (1 MG TOTAL) BY MOUTH AT BEDTIME. 30 tablet 6  . metFORMIN (GLUCOPHAGE) 1000 MG tablet Take 1,000 mg by mouth Twice daily.     . metoprolol tartrate (LOPRESSOR) 25 MG tablet Take 0.5 tablets (12.5 mg total) by mouth 2 (two) times daily. Please keep upcoming appointment for further refills 30 tablet 11   No current facility-administered medications on file prior to visit.     Allergies  Allergen Reactions  . Codeine Rash  . Phenergan [Promethazine Hcl] Anxiety    Patient becomes restless & anxious    Assessment/Plan: Hyperlipidemia: LDL and nonHDL are not at goal. He will continue on ezetimibe 10mg  daily. Will pursue PCSK9i therapy. Discussed injection technique, risk/benefit, and cost obligations. He is comfortable  given injection. Once approved pt should qualify for copay card. [facility]   Thank you,  Lelan Pons. Patterson Hammersmith, [facility]  11/29/2017 7:32 AM

## 2017-11-30 ENCOUNTER — Encounter: Payer: Self-pay | Admitting: Pharmacist

## 2017-11-30 ENCOUNTER — Ambulatory Visit (INDEPENDENT_AMBULATORY_CARE_PROVIDER_SITE_OTHER): Payer: BLUE CROSS/BLUE SHIELD | Admitting: Pharmacist

## 2017-11-30 DIAGNOSIS — E785 Hyperlipidemia, unspecified: Secondary | ICD-10-CM | POA: Diagnosis not present

## 2017-11-30 NOTE — Patient Instructions (Addendum)
We will send for coverage of PCSK9i therapy (Praluent or Repatha) injection.   Once approved we will send to the pharmacy. Please call our office once you start the medication [phone]).   Cholesterol Cholesterol is a fat. Your body needs a small amount of cholesterol. Cholesterol (plaque) may build up in your blood vessels (arteries). That makes you more likely to have a heart attack or stroke. You cannot feel your cholesterol level. Having a blood test is the only way to find out if your level is high. Keep your test results. Work with your doctor to keep your cholesterol at a good level. What do the results mean?  Total cholesterol is how much cholesterol is in your blood.  LDL is bad cholesterol. This is the type that can build up. Try to have low LDL.  HDL is good cholesterol. It cleans your blood vessels and carries LDL away. Try to have high HDL.  Triglycerides are fat that the body can store or burn for energy. What are good levels of cholesterol?  Total cholesterol below 200.  LDL below 100 is good for people who have health risks. LDL below 70 is good for people who have very high risks.  HDL above 40 is good. It is best to have HDL of 60 or higher.  Triglycerides below 150. How can I lower my cholesterol? Diet Follow your diet program as told by your doctor.  Choose fish, white meat chicken, or [place] that is roasted or baked. Try not to eat red meat, fried foods, sausage, or lunch meats.  Eat lots of fresh fruits and vegetables.  Choose whole grains, beans, pasta, potatoes, and cereals.  Choose olive oil, corn oil, or canola oil. Only use small amounts.  Try not to eat butter, mayonnaise, shortening, or palm kernel oils.  Try not to eat foods with trans fats.  Choose low-fat or nonfat dairy foods. ? Drink skim or nonfat milk. ? Eat low-fat or nonfat yogurt and cheeses. ? Try not to drink whole milk or cream. ? Try not to eat ice cream, egg yolks, or  full-fat cheeses.  Healthy desserts include angel food cake, ginger snaps, animal crackers, hard candy, popsicles, and low-fat or nonfat frozen yogurt. Try not to eat pastries, cakes, pies, and cookies.  Exercise Follow your exercise program as told by your doctor.  Be more active. Try gardening, walking, and taking the stairs.  Ask your doctor about ways that you can be more active.  Medicine  Take over-the-counter and prescription medicines only as told by your doctor. This information is not intended to replace advice given to you by your health care provider. Make sure you discuss any questions you have with your health care provider. Document Released: 09/16/2008 Document Revised: 01/20/2016 Document Reviewed: 12/31/2015 Elsevier Interactive Patient Education  [employer].

## 2017-12-04 ENCOUNTER — Telehealth: Payer: Self-pay | Admitting: Pharmacist

## 2017-12-04 MED ORDER — EVOLOCUMAB 140 MG/ML ~~LOC~~ SOAJ: 140.0000 mg | SUBCUTANEOUS | 3 refills | Status: DC

## 2017-12-04 NOTE — Telephone Encounter (Signed)
Pt approved for Repatha.   Spoke with pt and advised of above. [facility] [address]. He also reports that dosage on pioglitazone is 45mg  and this has been updated. He is aware to call once he receives medication so that labs can be scheduled.

## 2017-12-15 NOTE — Telephone Encounter (Addendum)
LMOM for pt to see if he has received Repatha shipment yet so that we can schedule follow up lab work.  [facility] any Rep[facility] to pt yet. They stated that pt did not want to start on Repatha even with $5 copay card applied, and that he wanted to continue on his statin instead. Per most recent lipid visit 2 weeks ago, pt could not tolerate any statins and was only taking Zetia.  Will wait for patient's return call to explain the need for Repatha therapy again.

## 2018-09-07 ENCOUNTER — Other Ambulatory Visit: Payer: Self-pay | Admitting: Cardiology

## 2018-10-19 ENCOUNTER — Telehealth: Payer: Self-pay | Admitting: Cardiology

## 2018-10-19 NOTE — Telephone Encounter (Signed)
Video/Webex/verbal visit/ will have vitals

## 2018-10-22 ENCOUNTER — Telehealth: Payer: Self-pay | Admitting: *Deleted

## 2018-10-22 ENCOUNTER — Telehealth: Payer: Self-pay | Admitting: Pharmacist

## 2018-10-22 ENCOUNTER — Other Ambulatory Visit: Payer: Self-pay

## 2018-10-22 ENCOUNTER — Telehealth (INDEPENDENT_AMBULATORY_CARE_PROVIDER_SITE_OTHER): Payer: BLUE CROSS/BLUE SHIELD | Admitting: Cardiology

## 2018-10-22 VITALS — BP 127/76 | HR 62 | Ht 71.0 in | Wt 209.0 lb

## 2018-10-22 DIAGNOSIS — Z7189 Other specified counseling: Secondary | ICD-10-CM | POA: Diagnosis not present

## 2018-10-22 DIAGNOSIS — I6523 Occlusion and stenosis of bilateral carotid arteries: Secondary | ICD-10-CM | POA: Diagnosis not present

## 2018-10-22 DIAGNOSIS — E119 Type 2 diabetes mellitus without complications: Secondary | ICD-10-CM

## 2018-10-22 DIAGNOSIS — I2510 Atherosclerotic heart disease of native coronary artery without angina pectoris: Secondary | ICD-10-CM

## 2018-10-22 DIAGNOSIS — E785 Hyperlipidemia, unspecified: Secondary | ICD-10-CM

## 2018-10-22 DIAGNOSIS — I10 Essential (primary) hypertension: Secondary | ICD-10-CM

## 2018-10-22 MED ORDER — METOPROLOL TARTRATE 25 MG PO TABS: 12.5000 mg | ORAL_TABLET | Freq: Two times a day (BID) | ORAL | 11 refills | Status: DC

## 2018-10-22 NOTE — Progress Notes (Signed)
Virtual Visit attempted but due to video is not working and therefore changed to a telephone Note   This visit type was conducted due to national recommendations for restrictions regarding the COVID-19 Pandemic (e.g. social distancing) in an effort to limit this patient's exposure and mitigate transmission in our community.  Due to his co-morbid illnesses, this patient is at least at moderate risk for complications without adequate follow up.  This format is felt to be most appropriate for this patient at this time.  All issues noted in this document were discussed and addressed.  A limited physical exam was performed with this format.  Please refer to the patient's chart for his consent to telehealth for [facility].  Evaluation Performed:  Follow-up visit  This visit type was conducted due to national recommendations for restrictions regarding the COVID-19 Pandemic (e.g. social distancing).  This format is felt to be most appropriate for this patient at this time.  All issues noted in this document were discussed and addressed.  No physical exam was performed (except for noted visual exam findings with Video Visits).  Please refer to the patient's chart (MyChart message for video visits and phone note for telephone visits) for the patient's consent to telehealth for [facility].  Date:  10/22/2018   ID:  Joel Berger, DOB 04-19-1961, MRN [id number]  Patient Location:  Home  Provider location:   [place]  PCP:  Enid Skeens., MD  Cardiologist:  Fransico Him, MD Electrophysiologist:  None   Chief Complaint:  CAD, HTN  History of Present Illness:    Joel Berger is a 58 y.o. male who presents via audio/video conferencing for a telehealth visit today.    Joel Berger is a 58 y.o. 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 Post op course  was notable for AFib but converted to NSR.He has known bilateral mild carotid stenosis with 1-39% bilateral stenosis noted by dopplers in 2016.  He is here today for followup and is doing well.  He denies any chest pain or pressure, SOB, DOE, PND, orthopnea, LE edema, dizziness, palpitations or syncope. He is compliant with his meds and is tolerating meds with no SE.    The patient does not have symptoms concerning for COVID-19 infection (fever, chills, cough, or new shortness of breath).    Prior CV studies:   The following studies were reviewed today:  none  Past Medical History:  Diagnosis Date  . CAD (coronary artery disease)    a. late presenting Inf MI >> LHC with 3v CAD >> s/p CABG with L-LAD, S-D1, S-PDA.  . Carotid stenosis    a. Carotid US 8/16:  bilat ICA 1-39%  . Diabetes mellitus   . Dyslipidemia, goal LDL below 70 04/29/2015  . History of echocardiogram    a. Echo 8/16:  EF 50-55%, inf AK, mild MR  . Hypertension   . Postoperative atrial fibrillation (HCC)    a. post CABG AF >> converted to NSR with Amiodarone   Past Surgical History:  Procedure Laterality Date  . CARDIAC CATHETERIZATION N/A 03/01/2015   Procedure: Left Heart Cath and Coronary Angiography;  Surgeon: Belva Crome, MD;  Location: [place] [facility];  Service: Cardiovascular;  Laterality: N/A;  . CORONARY ARTERY BYPASS GRAFT N/A 03/03/2015   Procedure: CORONARY ARTERY BYPASS GRAFTING (CABG),  ON PUMP, TIMES THREE, USING LEFT INTERNAL MAMMARY ARTERY, RIGHT GREATER SAPHENOUS VEIN HARVESTED ENDOSCOPICALLY;  Surgeon: Ivin Poot, MD;  Location: [place];  Service: Open Heart Surgery;  Laterality: N/A;  LIMA TO LAD, SVG TO DIAGONAL, SVG TO PDA  . TEE WITHOUT CARDIOVERSION N/A 03/03/2015   Procedure: TRANSESOPHAGEAL ECHOCARDIOGRAM (TEE);  Surgeon: Ivin Poot, MD;  Location: [place];  Service: Open Heart Surgery;  Laterality: N/A;     Current Meds  Medication Sig  . ACCU-CHEK AVIVA PLUS test strip 1 each by  Other route as directed.   . ezetimibe (ZETIA) 10 MG tablet TAKE 1 TABLET BY MOUTH EVERY DAY  . glipiZIDE (GLUCOTROL) 10 MG tablet Take 10 mg by mouth Twice daily.   Marland Kitchen JANUVIA 50 MG tablet Take 1 tablet by mouth daily.  Marland Kitchen lisinopril (PRINIVIL,ZESTRIL) 2.5 MG tablet Take 2.5 mg by mouth Daily.   . metFORMIN (GLUCOPHAGE) 1000 MG tablet Take 1,000 mg by mouth Twice daily.   . pioglitazone (ACTOS) 15 MG tablet Take 45 mg by mouth daily.  . [DISCONTINUED] metoprolol tartrate (LOPRESSOR) 25 MG tablet Take 0.5 tablets (12.5 mg total) by mouth 2 (two) times daily. Please keep upcoming appointment for further refills     Allergies:   Codeine and Phenergan [promethazine hcl]   Social History   Tobacco Use  . Smoking status: Never Smoker  . Smokeless tobacco: Never Used  Substance Use Topics  . Alcohol use: Yes    Comment: OCCASIONAL BEER,ONCE WEEK  . Drug use: No     Family Hx: The patient's family history includes Colon polyps in his paternal uncle; Heart attack in his brother and father; Heart failure in his mother. There is no history of Colon cancer.  ROS:   Please see the history of present illness.    \ All other systems reviewed and are negative.   Labs/Other Tests and Data Reviewed:    Recent Labs: 10/26/2017: ALT 15   Recent Lipid Panel Lab Results  Component Value Date/Time   CHOL 149 10/26/2017 08:21 AM   TRIG 75 10/26/2017 08:21 AM   HDL 42 10/26/2017 08:21 AM   CHOLHDL 3.5 10/26/2017 08:21 AM   CHOLHDL 3.0 02/08/2016 08:45 AM   LDLCALC 92 10/26/2017 08:21 AM    Wt Readings from Last 3 Encounters:  10/22/18 209 lb (94.8 kg)  08/25/17 221 lb 12.8 oz (100.6 kg)  07/29/16 200 lb (90.7 kg)     Objective:    Vital Signs:  BP 127/76   Pulse 62   Ht 5\' 11"  (1.803 m)   Wt 209 lb (94.8 kg)   SpO2 99%   BMI 29.15 kg/m     ASSESSMENT & PLAN:    1.  ASCAD - s/p inferior STEMI with cath showing occluded distal RCA, 90% LAD and 70% D1 stenosis with EF 45-50%.  He  underwent CABG with LIMA-LAD, SVG-D1 and SVG-PDA. He has not had any anginal symptoms since I saw him last.  He will continue on ASA 81mg  daily, Lopressor 12.5mg  BID and he is soon going to start PCSK9i.  He is statin intolerant.   2.  HTN - His BP is well controlled.  He will continue on Lopressor 12.5mg  BID and Lisinopril 2.5mg  daily.  His creatinine was 0.73 a year ago.  Repeat BMET in July.   3.  Hyperlipidemia - his LDL goal is < 70.  His LDL was 92 a year ago. He is statin intolerant and has been approved  for PCSK9i but is having problems with his pharmacy.  I will let our PharmD know so she can contact his pharmacy to find out what the problem is.  He will continue on Zetia 10mg  daily and then repeat FLP and ALT in July.   4.  Bilateral carotid artery stenosis - Dopplers 03-02-2015 showed 1-39% stenosis.  He will continue on ASA.  I will repeat dopplers to make sure this is stable.   5.  Type 2 DM - this is followed by his PCP.  His last HbA1C was <7.  He will continue on Glipizide 10mg  daily, Januvia 50mg  daily, pioglitazone 45mg  daily and metformin 1000mg  daily.   5.  COVID-19 Education:The signs and symptoms of COVID-19 were discussed with the patient and how to seek care for testing (follow up with PCP or arrange E-visit).  The importance of social distancing was discussed today.  Patient Risk:   After full review of this patient's clinical status, I feel that they are at least moderate risk at this time.  Time:   Today, I have spent 20 minutes directly with the patient on telephone (video attempted but camera would not work) discussing medical problems including CAD, HTN, lipids and carotid dopplers.  We also reviewed the symptoms of COVID 19 and the ways to protect against contracting the virus with telehealth technology.  I spent an additional 10 minutes reviewing patient's chart including reviewing labs, 2D echo and prior notes.  Medication Adjustments/Labs and Tests Ordered: Current  medicines are reviewed at length with the patient today.  Concerns regarding medicines are outlined above.  Tests Ordered: No orders of the defined types were placed in this encounter.  Medication Changes: Meds ordered this encounter  Medications  . metoprolol tartrate (LOPRESSOR) 25 MG tablet    Sig: Take 0.5 tablets (12.5 mg total) by mouth 2 (two) times daily.    Dispense:  30 tablet    Refill:  11    Disposition:  Follow up in 1 year(s)  Signed, Fransico Him, MD  10/22/2018 10:36 AM    [facility]

## 2018-10-22 NOTE — Telephone Encounter (Deleted)
hc

## 2018-10-22 NOTE — Telephone Encounter (Signed)
Received message about pt starting Repatha - Per our last conversation with pt he declined to start the medication (see phone note from 12/04/17). Will reach out to him again at Dr. Theodosia Blender request. Per our records will require another PA as original expired in Nov 2019.   Spoke with pt and he states that he is trying to decide about Livalo vs. PCSK9i therapy. All questions answered about injectable therapy - he decided that he would like to pursue injections again. He states that he hopes we will not have issues with pharmacy - advised that [facility] been difficult at times with PCSK9i therapy - but could use another pharmacy. He will consider [facility] in [place], but let us know once we have approval through insurance.   Advised that will need to obtain coverage again and will let him know once coverage obtained.

## 2018-10-22 NOTE — Telephone Encounter (Signed)
SPOKE WITH PT AND PT HAS CONSENTED     DOXIMITY or DOXY.ME - The patient will receive a link just prior to their visit, either by text or email (to be determined day of appointment depending on if it's doxy.me or Doximity).  Confirm consent - "In the setting of the current Covid19 crisis, you are scheduled for a (phone or video) visit with your provider on (date) at (time).  Just as we do with many in-office visits, in order for you to participate in this visit, we must obtain consent.  If you'd like, I can send this to your mychart (if signed up) or email for you to review.  Otherwise, I can obtain your verbal consent now.  All virtual visits are billed to your insurance company just like a normal visit would be.  By agreeing to a virtual visit, we'd like you to understand that the technology does not allow for your provider to perform an examination, and thus may limit your provider's ability to fully assess your condition. If your provider identifies any concerns that need to be evaluated in person, we will make arrangements to do so.  Finally, though the technology is pretty good, we cannot assure that it will always work on either your or our end, and in the setting of a video visit, we may have to convert it to a phone-only visit.  In either situation, we cannot ensure that we have a secure connection.  Are you willing to proceed?" STAFF: Did the patient verbally acknowledge consent to telehealth visit? Document YES/NO here:  YES    TELEPHONE CALL NOTE  Stevens Magwood Zilka has been deemed a candidate for a follow-up tele-health visit to limit community exposure during the Covid-19 pandemic. I spoke with the patient via phone to ensure availability of phone/video source, confirm preferred email & phone number, and discuss instructions and expectations.  I reminded Wayde Gopaul Allman to be prepared with any vital sign and/or heart rhythm information that could potentially be obtained via home monitoring, at  the time of his visit. I reminded Collier Bohnet Moon to expect a phone call at the time of his visit if his visit.  Claude Manges, [place] 10/22/2018 10:06 AM     FULL LENGTH CONSENT FOR TELE-HEALTH VISIT   I hereby voluntarily request, consent and authorize [facility] and its employed or contracted physicians, physician assistants, nurse practitioners or other licensed health care professionals (the Practitioner), to provide me with telemedicine health care services (the Services") as deemed necessary by the treating Practitioner. I acknowledge and consent to receive the Services by the Practitioner via telemedicine. I understand that the telemedicine visit will involve communicating with the Practitioner through live audiovisual communication technology and the disclosure of certain medical information by electronic transmission. I acknowledge that I have been given the opportunity to request an in-person assessment or other available alternative prior to the telemedicine visit and am voluntarily participating in the telemedicine visit.  I understand that I have the right to withhold or withdraw my consent to the use of telemedicine in the course of my care at any time, without affecting my right to future care or treatment, and that the Practitioner or I may terminate the telemedicine visit at any time. I understand that I have the right to inspect all information obtained and/or recorded in the course of the telemedicine visit and may receive copies of available information for a reasonable fee.  I understand that some of the  potential risks of receiving the Services via telemedicine include:   Delay or interruption in medical evaluation due to technological equipment failure or disruption;  Information transmitted may not be sufficient (e.g. poor resolution of images) to allow for appropriate medical decision making by the Practitioner; and/or   In rare instances, security protocols could fail,  causing a breach of personal health information.  Furthermore, I acknowledge that it is my responsibility to provide information about my medical history, conditions and care that is complete and accurate to the best of my ability. I acknowledge that Practitioner's advice, recommendations, and/or decision may be based on factors not within their control, such as incomplete or inaccurate data provided by me or distortions of diagnostic images or specimens that may result from electronic transmissions. I understand that the practice of medicine is not an exact science and that Practitioner makes no warranties or guarantees regarding treatment outcomes. I acknowledge that I will receive a copy of this consent concurrently upon execution via email to the email address I last provided but may also request a printed copy by calling the office of [place].    I understand that my insurance will be billed for this visit.   I have read or had this consent read to me.  I understand the contents of this consent, which adequately explains the benefits and risks of the Services being provided via telemedicine.   I have been provided ample opportunity to ask questions regarding this consent and the Services and have had my questions answered to my satisfaction.  I give my informed consent for the services to be provided through the use of telemedicine in my medical care  By participating in this telemedicine visit I agree to the above.

## 2018-10-22 NOTE — Patient Instructions (Addendum)
Medication Instructions:  Your physician recommends that you continue on your current medications as directed. Please refer to the Current Medication list given to you today.  If you need a refill on your cardiac medications before your next appointment, please call your pharmacy.   Lab work: Fasting Labs: BMET, LFT and Lipids in July  If you have labs (blood work) drawn today and your tests are completely normal, you will receive your results only by: Marland Kitchen MyChart Message (if you have MyChart) OR . A paper copy in the mail If you have any lab test that is abnormal or we need to change your treatment, we will call you to review the results.  Testing/Procedures: Your physician has requested that you have a carotid duplex around 01/21/19. This test is an ultrasound of the carotid arteries in your neck. It looks at blood flow through these arteries that supply the brain with blood. Allow one hour for this exam. There are no restrictions or special instructions.  Follow-Up: At [facility], you and your health needs are our priority.  As part of our continuing mission to provide you with exceptional heart care, we have created designated Provider Care Teams.  These Care Teams include your primary Cardiologist (physician) and Advanced Practice Providers (APPs -  Physician Assistants and Nurse Practitioners) who all work together to provide you with the care you need, when you need it. You will need a follow up appointment in 1 years.  Please call our office 2 months in advance to schedule this appointment.  You may see Dr. Radford Pax or one of the following Advanced Practice Providers on your designated Care Team:   [place], PA-C Melina Copa, PA-C . Ermalinda Barrios, PA-C

## 2018-10-23 ENCOUNTER — Ambulatory Visit: Payer: BLUE CROSS/BLUE SHIELD | Admitting: Cardiology

## 2018-10-29 NOTE — Telephone Encounter (Signed)
Repatha approved through 04/23/2019. Attepmpted to call patient to let him know he was approved and ask what pharmacy he would like rx called into. Left voicemail for patient to return message. Will also need rx copay card sent in

## 2018-10-30 NOTE — Telephone Encounter (Signed)
LMOM to follow up on preferred pharmacy and let know that approved through insurance.

## 2018-11-02 MED ORDER — EVOLOCUMAB 140 MG/ML ~~LOC~~ SOAJ: 140.0000 mg | SUBCUTANEOUS | 11 refills | Status: DC

## 2018-11-02 NOTE — Telephone Encounter (Signed)
Pt aware of approval. [facility]. Copay card on rx and placed in mail to patient.

## 2018-11-02 NOTE — Addendum Note (Signed)
Addended by: Erskine Emery on: 11/02/2018 08:39 AM   Modules accepted: Orders

## 2018-11-16 ENCOUNTER — Telehealth: Payer: Self-pay | Admitting: Cardiology

## 2018-11-16 NOTE — Telephone Encounter (Signed)
New Message  11-16-18/11:43am. Called Pt to schedule July carotid dopplers. Unable to reach Pt. Voice Mail full./renee val

## 2018-12-12 ENCOUNTER — Encounter (HOSPITAL_COMMUNITY): Payer: BLUE CROSS/BLUE SHIELD

## 2018-12-12 ENCOUNTER — Other Ambulatory Visit: Payer: Self-pay | Admitting: Cardiology

## 2019-01-11 ENCOUNTER — Other Ambulatory Visit: Payer: BLUE CROSS/BLUE SHIELD

## 2019-01-11 ENCOUNTER — Encounter (HOSPITAL_COMMUNITY): Payer: BLUE CROSS/BLUE SHIELD

## 2019-01-17 ENCOUNTER — Other Ambulatory Visit: Payer: Self-pay

## 2019-01-17 ENCOUNTER — Ambulatory Visit (HOSPITAL_COMMUNITY)
Admission: RE | Admit: 2019-01-17 | Discharge: 2019-01-17 | Disposition: A | Discharge status: Home / Self Care | Payer: BC Managed Care – PPO | Source: Ambulatory Visit | Attending: Cardiology | Admitting: Cardiology

## 2019-01-17 ENCOUNTER — Other Ambulatory Visit (INDEPENDENT_AMBULATORY_CARE_PROVIDER_SITE_OTHER): Payer: BC Managed Care – PPO | Admitting: *Deleted

## 2019-01-17 DIAGNOSIS — I6523 Occlusion and stenosis of bilateral carotid arteries: Secondary | ICD-10-CM

## 2019-01-17 DIAGNOSIS — E785 Hyperlipidemia, unspecified: Secondary | ICD-10-CM

## 2019-01-17 DIAGNOSIS — I10 Essential (primary) hypertension: Secondary | ICD-10-CM

## 2019-01-17 DIAGNOSIS — I2510 Atherosclerotic heart disease of native coronary artery without angina pectoris: Secondary | ICD-10-CM

## 2019-01-17 LAB — HEPATIC FUNCTION PANEL
ALT: 20 IU/L (ref 0–44)
AST: 16 IU/L (ref 0–40)
Albumin: 4.7 g/dL (ref 3.8–4.9)
Alkaline Phosphatase: 151 IU/L — ABNORMAL HIGH (ref 39–117)
Bilirubin Total: 0.6 mg/dL (ref 0.0–1.2)
Bilirubin, Direct: 0.21 mg/dL (ref 0.00–0.40)
Total Protein: 6.7 g/dL (ref 6.0–8.5)

## 2019-01-17 LAB — BASIC METABOLIC PANEL
BUN/Creatinine Ratio: 22 — ABNORMAL HIGH (ref 9–20)
BUN: 17 mg/dL (ref 6–24)
CO2: 26 mmol/L (ref 20–29)
Calcium: 9.5 mg/dL (ref 8.7–10.2)
Chloride: 99 mmol/L (ref 96–106)
Creatinine, Ser: 0.76 mg/dL (ref 0.76–1.27)
GFR calc Af Amer: 116 mL/min/{1.73_m2} (ref >59)
GFR calc non Af Amer: 101 mL/min/{1.73_m2} (ref >59)
Glucose: 224 mg/dL — ABNORMAL HIGH (ref 65–99)
Potassium: 4.5 mmol/L (ref 3.5–5.2)
Sodium: 139 mmol/L (ref 134–144)

## 2019-01-17 LAB — LIPID PANEL
Chol/HDL Ratio: 1.4 ratio (ref 0.0–5.0)
Cholesterol, Total: 80 mg/dL — ABNORMAL LOW (ref 100–199)
HDL: 56 mg/dL (ref >39)
LDL Calculated: 7 mg/dL (ref 0–99)
Triglycerides: 86 mg/dL (ref 0–149)
VLDL Cholesterol Cal: 17 mg/dL (ref 5–40)

## 2019-04-10 ENCOUNTER — Telehealth: Payer: Self-pay

## 2019-04-10 NOTE — Telephone Encounter (Signed)
Called and lmomed the pt to see if there is any new insurance and instructed the pt to call back

## 2019-04-15 ENCOUNTER — Telehealth: Payer: Self-pay

## 2019-04-15 NOTE — Telephone Encounter (Signed)
-----   Message from Leeroy Bock, [facility] sent at 04/15/2019  9:43 AM EDT ----- Regarding: RE: ISSUE We haven't heard back from him if you're able to try following up with him again, thanks! ----- Message ----- From: Allean Found, CMA Sent: 04/10/2019   1:45 PM EDT To: Leeroy Bock, RPH Subject: ISSUE                                          hey i am having issues submiting I called pharmacy but cmm can't find a plan for that pt so i called & lmomed the pt to call the chst office back with his insurance card do me a favor also obtain the pharmacy help desk number on the back of the card if you can... The info the pharmacy gave me is id: [id number], bin [id number], pcn [id number], GRP [id number].  Message from [place] find matching patient with Name and Date of Birth provided. For additional information, please contact the phone number on the back of the member prescription ID card. AND DOB I USED WAS 2060/11/13

## 2019-04-15 NOTE — Telephone Encounter (Signed)
lmomed for the pt to call us back to clarify insurance

## 2019-05-17 ENCOUNTER — Telehealth: Payer: Self-pay | Admitting: Pharmacist

## 2019-05-17 NOTE — Telephone Encounter (Signed)
Pt left voicemail stating that his pharmacy needs additional information to process his Repatha prescription. We have left 2 message for pt so far asking for his updated insurance information so that a prior authorization can be completed.   Called pt and left another message.

## 2019-05-17 NOTE — Telephone Encounter (Signed)
Pt returned call, states he received a new card - has [employer]  ID: [id number] [place]: [id number] PCN: [id number] GRP: [id number]

## 2019-05-20 NOTE — Telephone Encounter (Signed)
Called the pt back to see when the card goes into effect lmomed the pt and will await callback

## 2019-05-20 NOTE — Telephone Encounter (Signed)
Called and notified the that I got the insurance coordination worked out and sent to plan for the repatha and that we are currently awaiting a response.

## 2019-05-24 NOTE — Telephone Encounter (Signed)
PA for Repatha approved through 05/19/2020- patient made aware

## 2019-09-12 ENCOUNTER — Ambulatory Visit: Payer: BC Managed Care – PPO | Attending: Internal Medicine

## 2019-09-12 DIAGNOSIS — Z23 Encounter for immunization: Secondary | ICD-10-CM

## 2019-09-12 NOTE — Progress Notes (Signed)
   Covid-19 Vaccination Clinic  Name:  Joel Berger    MRN: [id number] DOB: Oct 15, 1960  09/12/2019  [name] was observed post Covid-19 immunization for 15 minutes without incident. He was provided with Vaccine Information Sheet and instruction to access the V-Safe system.   [name] was instructed to call 911 with any severe reactions post vaccine: Marland Kitchen Difficulty breathing  . Swelling of face and throat  . A fast heartbeat  . A bad rash all over body  . Dizziness and weakness   Immunizations Administered    Name Date Dose VIS Date Route   Pfizer COVID-19 Vaccine 09/12/2019  3:39 PM 0.3 mL 06/14/2019 Intramuscular   Manufacturer: [place]   Lot: [id number]   [place]: [id number]

## 2019-10-02 ENCOUNTER — Other Ambulatory Visit: Payer: Self-pay | Admitting: Cardiology

## 2019-10-08 ENCOUNTER — Ambulatory Visit: Payer: BC Managed Care – PPO | Attending: Internal Medicine

## 2019-10-08 DIAGNOSIS — Z23 Encounter for immunization: Secondary | ICD-10-CM

## 2019-10-08 NOTE — Progress Notes (Signed)
   Covid-19 Vaccination Clinic  Name:  Joel Berger    MRN: [id number] DOB: Oct 21, 1960  10/08/2019  Mr. Guidice was observed post Covid-19 immunization for 15 minutes without incident. He was provided with Vaccine Information Sheet and instruction to access the V-Safe system.   Mr. Degruy was instructed to call 911 with any severe reactions post vaccine: Marland Kitchen Difficulty breathing  . Swelling of face and throat  . A fast heartbeat  . A bad rash all over body  . Dizziness and weakness   Immunizations Administered    Name Date Dose VIS Date Route   Pfizer COVID-19 Vaccine 10/08/2019 12:48 PM 0.3 mL 06/14/2019 Intramuscular   Manufacturer: [place]   Lot: [id number]   [place]: [id number]

## 2019-11-08 ENCOUNTER — Other Ambulatory Visit: Payer: Self-pay | Admitting: Cardiology

## 2019-11-23 ENCOUNTER — Other Ambulatory Visit: Payer: Self-pay | Admitting: Cardiology

## 2019-12-10 ENCOUNTER — Other Ambulatory Visit: Payer: Self-pay | Admitting: Cardiology

## 2020-01-03 ENCOUNTER — Other Ambulatory Visit: Payer: Self-pay | Admitting: Cardiology

## 2020-02-04 ENCOUNTER — Other Ambulatory Visit: Payer: Self-pay | Admitting: Cardiology

## 2020-02-14 ENCOUNTER — Ambulatory Visit: Payer: BC Managed Care – PPO | Admitting: Cardiology

## 2020-04-07 ENCOUNTER — Ambulatory Visit: Payer: BC Managed Care – PPO | Admitting: Cardiology

## 2020-04-20 ENCOUNTER — Encounter: Payer: Self-pay | Admitting: Cardiology

## 2020-04-20 ENCOUNTER — Other Ambulatory Visit: Payer: Self-pay

## 2020-04-20 ENCOUNTER — Ambulatory Visit: Payer: BC Managed Care – PPO | Admitting: Cardiology

## 2020-04-20 VITALS — BP 140/84 | HR 60 | Ht 71.0 in | Wt 221.0 lb

## 2020-04-20 DIAGNOSIS — I6521 Occlusion and stenosis of right carotid artery: Secondary | ICD-10-CM

## 2020-04-20 DIAGNOSIS — E119 Type 2 diabetes mellitus without complications: Secondary | ICD-10-CM

## 2020-04-20 DIAGNOSIS — E785 Hyperlipidemia, unspecified: Secondary | ICD-10-CM

## 2020-04-20 DIAGNOSIS — I10 Essential (primary) hypertension: Secondary | ICD-10-CM

## 2020-04-20 DIAGNOSIS — I2510 Atherosclerotic heart disease of native coronary artery without angina pectoris: Secondary | ICD-10-CM | POA: Diagnosis not present

## 2020-04-20 LAB — LIPID PANEL
Chol/HDL Ratio: 2.5 ratio (ref 0.0–5.0)
Cholesterol, Total: 133 mg/dL (ref 100–199)
HDL: 53 mg/dL (ref >39)
LDL Chol Calc (NIH): 58 mg/dL (ref 0–99)
Triglycerides: 122 mg/dL (ref 0–149)
VLDL Cholesterol Cal: 22 mg/dL (ref 5–40)

## 2020-04-20 LAB — COMPREHENSIVE METABOLIC PANEL
ALT: 16 IU/L (ref 0–44)
AST: 16 IU/L (ref 0–40)
Albumin/Globulin Ratio: 2.4 — ABNORMAL HIGH (ref 1.2–2.2)
Albumin: 4.7 g/dL (ref 3.8–4.9)
Alkaline Phosphatase: 88 IU/L (ref 44–121)
BUN/Creatinine Ratio: 21 — ABNORMAL HIGH (ref 9–20)
BUN: 15 mg/dL (ref 6–24)
Bilirubin Total: 0.5 mg/dL (ref 0.0–1.2)
CO2: 27 mmol/L (ref 20–29)
Calcium: 9.3 mg/dL (ref 8.7–10.2)
Chloride: 103 mmol/L (ref 96–106)
Creatinine, Ser: 0.72 mg/dL — ABNORMAL LOW (ref 0.76–1.27)
GFR calc Af Amer: 118 mL/min/{1.73_m2} (ref >59)
GFR calc non Af Amer: 102 mL/min/{1.73_m2} (ref >59)
Globulin, Total: 2 g/dL (ref 1.5–4.5)
Glucose: 158 mg/dL — ABNORMAL HIGH (ref 65–99)
Potassium: 4.4 mmol/L (ref 3.5–5.2)
Sodium: 142 mmol/L (ref 134–144)
Total Protein: 6.7 g/dL (ref 6.0–8.5)

## 2020-04-20 MED ORDER — LISINOPRIL 2.5 MG PO TABS: 2.5000 mg | ORAL_TABLET | Freq: Every day | ORAL | 3 refills | Status: DC

## 2020-04-20 NOTE — Addendum Note (Signed)
Addended by: Antonieta Iba on: 04/20/2020 08:38 AM   Modules accepted: Orders

## 2020-04-20 NOTE — Addendum Note (Signed)
Addended by: Antonieta Iba on: 04/20/2020 08:44 AM   Modules accepted: Orders

## 2020-04-20 NOTE — Patient Instructions (Signed)
Medication Instructions:  Your physician recommends that you continue on your current medications as directed. Please refer to the Current Medication list given to you today.  *If you need a refill on your cardiac medications before your next appointment, please call your pharmacy*   Lab Work: TODAY: CMET, FLP If you have labs (blood work) drawn today and your tests are completely normal, you will receive your results only by: . MyChart Message (if you have MyChart) OR . A paper copy in the mail If you have any lab test that is abnormal or we need to change your treatment, we will call you to review the results.   Follow-Up: At [facility], you and your health needs are our priority.  As part of our continuing mission to provide you with exceptional heart care, we have created designated Provider Care Teams.  These Care Teams include your primary Cardiologist (physician) and Advanced Practice Providers (APPs -  Physician Assistants and Nurse Practitioners) who all work together to provide you with the care you need, when you need it.  Your next appointment:   1 year(s)  The format for your next appointment:   In Person  Provider:   You may see Traci Turner, MD or one of the following Advanced Practice Providers on your designated Care Team:    Dayna Dunn, PA-C  Michele Lenze, PA-C     

## 2020-04-20 NOTE — Progress Notes (Addendum)
Date:  04/20/2020   ID:  Joel Berger, DOB 1960/07/24, MRN [id number]   PCP:  Enid Skeens., MD  Cardiologist:  Fransico Him, MD Electrophysiologist:  None   Chief Complaint:  CAD, HTN  History of Present Illness:    Joel Berger is a 59 y.o. 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 Post op course was notable for AFib but converted to NSR.He has known mild right  carotid stenosis with 1-39%  by dopplers in 2020.  He is here today for followup and is doing well.  He denies any chest pain or pressure, SOB, DOE, PND, orthopnea, LE edema, dizziness, palpitations or syncope. He is compliant with his meds and is tolerating meds with no SE.    Prior CV studies:   The following studies were reviewed today:  EKG and labs  Past Medical History:  Diagnosis Date  . CAD (coronary artery disease)    a. late presenting Inf MI >> LHC with 3v CAD >> s/p CABG with L-LAD, S-D1, S-PDA.  . Carotid stenosis    a. Carotid US 8/16:  bilat ICA 1-39%  . Diabetes mellitus   . Dyslipidemia, goal LDL below 70 04/29/2015  . History of echocardiogram    a. Echo 8/16:  EF 50-55%, inf AK, mild MR  . Hypertension   . Postoperative atrial fibrillation (HCC)    a. post CABG AF >> converted to NSR with Amiodarone   Past Surgical History:  Procedure Laterality Date  . CARDIAC CATHETERIZATION N/A 03/01/2015   Procedure: Left Heart Cath and Coronary Angiography;  Surgeon: Belva Crome, MD;  Location: [facility];  Service: Cardiovascular;  Laterality: N/A;  . CORONARY ARTERY BYPASS GRAFT N/A 03/03/2015   Procedure: CORONARY ARTERY BYPASS GRAFTING (CABG), ON PUMP, TIMES THREE, USING LEFT INTERNAL MAMMARY ARTERY, RIGHT GREATER SAPHENOUS VEIN HARVESTED ENDOSCOPICALLY;  Surgeon: Ivin Poot, MD;  Location: [facility];  Service: Open Heart Surgery;  Laterality: N/A;   LIMA TO LAD, SVG TO DIAGONAL, SVG TO PDA  . TEE WITHOUT CARDIOVERSION N/A 03/03/2015   Procedure: TRANSESOPHAGEAL ECHOCARDIOGRAM (TEE);  Surgeon: Ivin Poot, MD;  Location: [facility];  Service: Open Heart Surgery;  Laterality: N/A;     Current Meds  Medication Sig  . ACCU-CHEK AVIVA PLUS test strip 1 each by Other route as directed.   Marland Kitchen aspirin EC 81 MG tablet Take 81 mg by mouth daily. Swallow whole.  . ezetimibe (ZETIA) 10 MG tablet TAKE 1 TABLET BY MOUTH EVERY DAY  . glipiZIDE (GLUCOTROL) 10 MG tablet Take 10 mg by mouth Twice daily.   Marland Kitchen JANUVIA 50 MG tablet Take 100 tablets by mouth daily.   Marland Kitchen lisinopril (PRINIVIL,ZESTRIL) 2.5 MG tablet Take 2.5 mg by mouth Daily.   . metFORMIN (GLUCOPHAGE) 1000 MG tablet Take 1,000 mg by mouth Twice daily.   . metoprolol tartrate (LOPRESSOR) 25 MG tablet Take 0.5 tablets (12.5 mg total) by mouth 2 (two) times daily. Please keep upcoming appt in August with Dr. Radford Pax before anymore refills. Thank you  . pioglitazone (ACTOS) 15 MG tablet Take 45 mg by mouth daily.  Marland Kitchen REPATHA SURECLICK 947 MG/ML SOAJ INJECT 140 MG INTO THE SKIN EVERY 14 (FOURTEEN) DAYS.     Allergies:   Codeine and Phenergan [promethazine hcl]   Social History   Tobacco Use  .  Smoking status: Never Smoker  . Smokeless tobacco: Never Used  Substance Use Topics  . Alcohol use: Yes    Comment: OCCASIONAL BEER,ONCE WEEK  . Drug use: No     Family Hx: The patient's family history includes Colon polyps in his paternal uncle; Heart attack in his brother and father; Heart failure in his mother. There is no history of Colon cancer.  ROS:   Please see the history of present illness.    \ All other systems reviewed and are negative.   Labs/Other Tests and Data Reviewed:    Recent Labs: No results found for requested labs within last 8760 hours.   Recent Lipid Panel Lab Results  Component Value Date/Time   CHOL 80 (L) 01/17/2019 08:30 AM   TRIG 86 01/17/2019 08:30 AM   HDL  56 01/17/2019 08:30 AM   CHOLHDL 1.4 01/17/2019 08:30 AM   CHOLHDL 3.0 02/08/2016 08:45 AM   LDLCALC 7 01/17/2019 08:30 AM    Wt Readings from Last 3 Encounters:  04/20/20 221 lb (100.2 kg)  10/22/18 209 lb (94.8 kg)  08/25/17 221 lb 12.8 oz (100.6 kg)    EKG was reviewed today and showed NSR with no ST changes Objective:    Vital Signs:  BP 140/84   Pulse 60   Ht 5\' 11"  (1.803 m)   Wt 221 lb (100.2 kg)   SpO2 97%   BMI 30.82 kg/m    GEN: Well nourished, well developed in no acute distress HEENT: Normal NECK: No JVD; No carotid bruits LYMPHATICS: No lymphadenopathy CARDIAC:RRR, no murmurs, rubs, gallops RESPIRATORY:  Clear to auscultation without rales, wheezing or rhonchi  ABDOMEN: Soft, non-tender, non-distended MUSCULOSKELETAL:  No edema; No deformity  SKIN: Warm and dry NEUROLOGIC:  Alert and oriented x 3 PSYCHIATRIC:  Normal affect    ASSESSMENT & PLAN:    1.  ASCAD  - s/p inferior STEMI with cath showing occluded distal RCA, 90% LAD and 70% D1 stenosis with EF 45-50%. -s/p CABG with LIMA-LAD, SVG-D1 and SVG-PDA.  -he denies any anginal symptoms  -continue ASA 81mg  daily, Lopressor 12.5g BID and Repatha -statin intolerant  2.  HTN  -BP controlled on exam - at home his SBP runs 120-164mmHg -continue on Lopressor 12.5mg  BID and Lisinopril 2.5mg  daily -check BMET  3.  Hyperlipidemia  - his LDL goal is < 70.   -He is statin intolerant  -continue on Zetia 10mg  daily and Repatha -check FLP and ALT  4.  Right carotid artery stenosis  - Dopplers 2020 showed right 1-39% stenosis.   -continue ASA  5.  Type 2 DM  -this is followed by his PCP.   -last HbA1C was 7.5% -continue on Glipizide 10mg  daily, Januvia 100mg  daily, pioglitazone 45mg  daily and metformin 1000mg  BID.    Medication Adjustments/Labs and Tests Ordered: Current medicines are reviewed at length with the patient today.  Concerns regarding medicines are outlined above.  Tests Ordered: Orders  Placed This Encounter  Procedures  . EKG 12-Lead   Medication Changes: No orders of the defined types were placed in this encounter.   Disposition:  Follow up in 1 year(s)  Signed, Fransico Him, MD  04/20/2020 8:34 AM    [facility]

## 2020-04-27 ENCOUNTER — Telehealth: Payer: Self-pay

## 2020-04-27 NOTE — Telephone Encounter (Signed)
Called and spoke w/pt regarding the denial of repatha and stated that we sent in the information needed for an appeal and the pt voiced understanding

## 2020-05-06 ENCOUNTER — Telehealth: Payer: Self-pay

## 2020-05-06 NOTE — Telephone Encounter (Signed)
-----   Message from Ramond Dial, [place] sent at 05/06/2020  4:37 PM EDT ----- Can you let pt know. IDK if insurance called him ----- Message ----- From: Allean Found, [place] Sent: 05/06/2020   4:01 PM EDT To: Ramond Dial, [place] 04/27/21 ----- Message ----- From: Ramond Dial, RPH-CPP Sent: 05/06/2020   3:33 PM EDT To: Allean Found, CMA  Can you call insurance and follow up on appeals?

## 2020-05-06 NOTE — Telephone Encounter (Signed)
Called and spoke w/pt and stated that they are approved and that the pcsk9 was approved through 04/24/24

## 2020-06-01 ENCOUNTER — Other Ambulatory Visit: Payer: Self-pay | Admitting: Cardiology

## 2020-09-10 ENCOUNTER — Other Ambulatory Visit: Payer: Self-pay | Admitting: Cardiology

## 2020-11-09 ENCOUNTER — Other Ambulatory Visit: Payer: Self-pay | Admitting: Cardiology

## 2021-03-12 ENCOUNTER — Other Ambulatory Visit: Payer: Self-pay | Admitting: Cardiology

## 2021-04-15 ENCOUNTER — Encounter (HOSPITAL_BASED_OUTPATIENT_CLINIC_OR_DEPARTMENT_OTHER): Payer: Self-pay | Admitting: Emergency Medicine

## 2021-04-15 ENCOUNTER — Other Ambulatory Visit (HOSPITAL_COMMUNITY): Payer: BC Managed Care – PPO

## 2021-04-15 ENCOUNTER — Observation Stay (HOSPITAL_COMMUNITY): Payer: BC Managed Care – PPO

## 2021-04-15 ENCOUNTER — Other Ambulatory Visit: Payer: Self-pay

## 2021-04-15 ENCOUNTER — Telehealth: Payer: Self-pay | Admitting: Cardiology

## 2021-04-15 ENCOUNTER — Inpatient Hospital Stay (HOSPITAL_BASED_OUTPATIENT_CLINIC_OR_DEPARTMENT_OTHER)
Admission: EM | Admit: 2021-04-15 | Discharge: 2021-04-17 | DRG: 247 | Disposition: A | Discharge status: Home / Self Care | Payer: BC Managed Care – PPO | Attending: Cardiology | Admitting: Cardiology

## 2021-04-15 ENCOUNTER — Emergency Department (HOSPITAL_BASED_OUTPATIENT_CLINIC_OR_DEPARTMENT_OTHER): Payer: BC Managed Care – PPO | Admitting: Radiology

## 2021-04-15 DIAGNOSIS — M545 Low back pain, unspecified: Secondary | ICD-10-CM | POA: Diagnosis not present

## 2021-04-15 DIAGNOSIS — Y838 Other surgical procedures as the cause of abnormal reaction of the patient, or of later complication, without mention of misadventure at the time of the procedure: Secondary | ICD-10-CM | POA: Diagnosis not present

## 2021-04-15 DIAGNOSIS — R81 Glycosuria: Secondary | ICD-10-CM | POA: Diagnosis present

## 2021-04-15 DIAGNOSIS — E785 Hyperlipidemia, unspecified: Secondary | ICD-10-CM | POA: Diagnosis present

## 2021-04-15 DIAGNOSIS — R072 Precordial pain: Secondary | ICD-10-CM

## 2021-04-15 DIAGNOSIS — I2510 Atherosclerotic heart disease of native coronary artery without angina pectoris: Secondary | ICD-10-CM | POA: Diagnosis not present

## 2021-04-15 DIAGNOSIS — I10 Essential (primary) hypertension: Secondary | ICD-10-CM | POA: Diagnosis present

## 2021-04-15 DIAGNOSIS — Z8249 Family history of ischemic heart disease and other diseases of the circulatory system: Secondary | ICD-10-CM

## 2021-04-15 DIAGNOSIS — Z20822 Contact with and (suspected) exposure to covid-19: Secondary | ICD-10-CM | POA: Diagnosis present

## 2021-04-15 DIAGNOSIS — Z833 Family history of diabetes mellitus: Secondary | ICD-10-CM

## 2021-04-15 DIAGNOSIS — Z7984 Long term (current) use of oral hypoglycemic drugs: Secondary | ICD-10-CM

## 2021-04-15 DIAGNOSIS — Y92239 Unspecified place in hospital as the place of occurrence of the external cause: Secondary | ICD-10-CM | POA: Diagnosis not present

## 2021-04-15 DIAGNOSIS — Z955 Presence of coronary angioplasty implant and graft: Secondary | ICD-10-CM

## 2021-04-15 DIAGNOSIS — I97190 Other postprocedural cardiac functional disturbances following cardiac surgery: Secondary | ICD-10-CM | POA: Diagnosis not present

## 2021-04-15 DIAGNOSIS — I255 Ischemic cardiomyopathy: Secondary | ICD-10-CM | POA: Diagnosis present

## 2021-04-15 DIAGNOSIS — I200 Unstable angina: Secondary | ICD-10-CM | POA: Diagnosis present

## 2021-04-15 DIAGNOSIS — Z7982 Long term (current) use of aspirin: Secondary | ICD-10-CM

## 2021-04-15 DIAGNOSIS — I4891 Unspecified atrial fibrillation: Secondary | ICD-10-CM | POA: Diagnosis present

## 2021-04-15 DIAGNOSIS — Z885 Allergy status to narcotic agent status: Secondary | ICD-10-CM

## 2021-04-15 DIAGNOSIS — E876 Hypokalemia: Secondary | ICD-10-CM | POA: Diagnosis not present

## 2021-04-15 DIAGNOSIS — Z79899 Other long term (current) drug therapy: Secondary | ICD-10-CM

## 2021-04-15 DIAGNOSIS — I25700 Atherosclerosis of coronary artery bypass graft(s), unspecified, with unstable angina pectoris: Secondary | ICD-10-CM | POA: Diagnosis not present

## 2021-04-15 DIAGNOSIS — K8020 Calculus of gallbladder without cholecystitis without obstruction: Secondary | ICD-10-CM | POA: Diagnosis present

## 2021-04-15 DIAGNOSIS — I25110 Atherosclerotic heart disease of native coronary artery with unstable angina pectoris: Secondary | ICD-10-CM | POA: Diagnosis present

## 2021-04-15 DIAGNOSIS — I459 Conduction disorder, unspecified: Secondary | ICD-10-CM | POA: Diagnosis present

## 2021-04-15 DIAGNOSIS — Z951 Presence of aortocoronary bypass graft: Secondary | ICD-10-CM

## 2021-04-15 DIAGNOSIS — E119 Type 2 diabetes mellitus without complications: Secondary | ICD-10-CM

## 2021-04-15 DIAGNOSIS — I480 Paroxysmal atrial fibrillation: Secondary | ICD-10-CM | POA: Diagnosis not present

## 2021-04-15 DIAGNOSIS — I083 Combined rheumatic disorders of mitral, aortic and tricuspid valves: Secondary | ICD-10-CM | POA: Diagnosis present

## 2021-04-15 DIAGNOSIS — I700 Atherosclerosis of aorta: Secondary | ICD-10-CM | POA: Diagnosis present

## 2021-04-15 DIAGNOSIS — Z888 Allergy status to other drugs, medicaments and biological substances status: Secondary | ICD-10-CM

## 2021-04-15 DIAGNOSIS — I6529 Occlusion and stenosis of unspecified carotid artery: Secondary | ICD-10-CM | POA: Diagnosis present

## 2021-04-15 DIAGNOSIS — R079 Chest pain, unspecified: Secondary | ICD-10-CM | POA: Diagnosis present

## 2021-04-15 DIAGNOSIS — Z23 Encounter for immunization: Secondary | ICD-10-CM

## 2021-04-15 DIAGNOSIS — I252 Old myocardial infarction: Secondary | ICD-10-CM

## 2021-04-15 HISTORY — DX: Disorder of arteries and arterioles, unspecified: I77.9

## 2021-04-15 HISTORY — DX: Nonrheumatic mitral (valve) insufficiency: I34.0

## 2021-04-15 HISTORY — DX: Ischemic cardiomyopathy: I25.5

## 2021-04-15 LAB — URINALYSIS, ROUTINE W REFLEX MICROSCOPIC
Bacteria, UA: NONE SEEN
Bilirubin Urine: NEGATIVE
Glucose, UA: >=500 mg/dL — AB
Ketones, ur: 80 mg/dL — AB
Leukocytes,Ua: NEGATIVE
Nitrite: NEGATIVE
Protein, ur: NEGATIVE mg/dL
Specific Gravity, Urine: 1.021 (ref 1.005–1.030)
pH: 5 (ref 5.0–8.0)

## 2021-04-15 LAB — CBC
HCT: 46.4 % (ref 39.0–52.0)
Hemoglobin: 16 g/dL (ref 13.0–17.0)
MCH: 29.4 pg (ref 26.0–34.0)
MCHC: 34.5 g/dL (ref 30.0–36.0)
MCV: 85.1 fL (ref 80.0–100.0)
Platelets: 198 10*3/uL (ref 150–400)
RBC: 5.45 MIL/uL (ref 4.22–5.81)
RDW: 13 % (ref 11.5–15.5)
WBC: 4.7 10*3/uL (ref 4.0–10.5)
nRBC: 0 % (ref 0.0–0.2)

## 2021-04-15 LAB — BASIC METABOLIC PANEL
Anion gap: 10 (ref 5–15)
BUN: 19 mg/dL (ref 6–20)
CO2: 28 mmol/L (ref 22–32)
Calcium: 9.4 mg/dL (ref 8.9–10.3)
Chloride: 101 mmol/L (ref 98–111)
Creatinine, Ser: 0.66 mg/dL (ref 0.61–1.24)
GFR, Estimated: >60 mL/min (ref >60)
Glucose, Bld: 142 mg/dL — ABNORMAL HIGH (ref 70–99)
Potassium: 3.8 mmol/L (ref 3.5–5.1)
Sodium: 139 mmol/L (ref 135–145)

## 2021-04-15 LAB — RESP PANEL BY RT-PCR (FLU A&B, COVID) ARPGX2
Influenza A by PCR: NEGATIVE
Influenza B by PCR: NEGATIVE
SARS Coronavirus 2 by RT PCR: NEGATIVE

## 2021-04-15 LAB — GLUCOSE, CAPILLARY
Glucose-Capillary: 84 mg/dL (ref 70–99)
Glucose-Capillary: 178 mg/dL — ABNORMAL HIGH (ref 70–99)

## 2021-04-15 LAB — TROPONIN I (HIGH SENSITIVITY)
Troponin I (High Sensitivity): 5 ng/L (ref <18)
Troponin I (High Sensitivity): 5 ng/L (ref <18)

## 2021-04-15 LAB — HEPARIN LEVEL (UNFRACTIONATED): Heparin Unfractionated: <0.1 IU/mL — ABNORMAL LOW (ref 0.30–0.70)

## 2021-04-15 MED ORDER — ASPIRIN EC 81 MG PO TBEC
81.0000 mg | DELAYED_RELEASE_TABLET | Freq: Every day | ORAL | Status: DC
  Administered 2021-04-16 – 2021-04-17 (×2): 81 mg via ORAL
  Filled 2021-04-15 (×2): qty 1

## 2021-04-15 MED ORDER — HEPARIN BOLUS VIA INFUSION
4000.0000 [IU] | Freq: Once | INTRAVENOUS | Status: AC
  Administered 2021-04-15: 4000 [IU] via INTRAVENOUS

## 2021-04-15 MED ORDER — LISINOPRIL 5 MG PO TABS
2.5000 mg | ORAL_TABLET | Freq: Every day | ORAL | Status: DC
  Administered 2021-04-16 – 2021-04-17 (×2): 2.5 mg via ORAL
  Filled 2021-04-15 (×2): qty 1

## 2021-04-15 MED ORDER — METOPROLOL TARTRATE 12.5 MG HALF TABLET
12.5000 mg | ORAL_TABLET | Freq: Two times a day (BID) | ORAL | Status: DC
  Administered 2021-04-15: 12.5 mg via ORAL
  Filled 2021-04-15: qty 1

## 2021-04-15 MED ORDER — HEPARIN (PORCINE) 25000 UT/250ML-% IV SOLN
1550.0000 [IU]/h | INTRAVENOUS | Status: DC
  Filled 2021-04-15: qty 250

## 2021-04-15 MED ORDER — ACETAMINOPHEN 325 MG PO TABS: 650.0000 mg | ORAL_TABLET | ORAL | Status: DC | PRN

## 2021-04-15 MED ORDER — NITROGLYCERIN 0.4 MG SL SUBL
0.4000 mg | SUBLINGUAL_TABLET | SUBLINGUAL | Status: DC | PRN
  Administered 2021-04-15: 0.4 mg via SUBLINGUAL
  Filled 2021-04-15: qty 1

## 2021-04-15 MED ORDER — EZETIMIBE 10 MG PO TABS
10.0000 mg | ORAL_TABLET | Freq: Every day | ORAL | Status: DC
  Administered 2021-04-16 – 2021-04-17 (×2): 10 mg via ORAL
  Filled 2021-04-15 (×2): qty 1

## 2021-04-15 MED ORDER — INSULIN ASPART 100 UNIT/ML IJ SOLN
0.0000 [IU] | Freq: Three times a day (TID) | INTRAMUSCULAR | Status: DC
Start: 2021-04-16 — End: 2021-04-17
  Administered 2021-04-16 (×2): 1 [IU] via SUBCUTANEOUS

## 2021-04-15 MED ORDER — HEPARIN (PORCINE) 25000 UT/250ML-% IV SOLN
1350.0000 [IU]/h | INTRAVENOUS | Status: DC
  Administered 2021-04-15: 1350 [IU]/h via INTRAVENOUS
  Filled 2021-04-15: qty 250

## 2021-04-15 MED ORDER — HEPARIN BOLUS VIA INFUSION
2700.0000 [IU] | Freq: Once | INTRAVENOUS | Status: AC
  Administered 2021-04-15: 2700 [IU] via INTRAVENOUS
  Filled 2021-04-15: qty 2700

## 2021-04-15 MED ORDER — TRAMADOL HCL 50 MG PO TABS
50.0000 mg | ORAL_TABLET | Freq: Two times a day (BID) | ORAL | Status: DC | PRN
  Administered 2021-04-15: 50 mg via ORAL
  Filled 2021-04-15: qty 1

## 2021-04-15 MED ORDER — ONDANSETRON HCL 4 MG/2ML IJ SOLN: 4.0000 mg | Freq: Four times a day (QID) | INTRAMUSCULAR | Status: DC | PRN

## 2021-04-15 MED ORDER — ASPIRIN 81 MG PO CHEW
162.0000 mg | CHEWABLE_TABLET | Freq: Once | ORAL | Status: AC
  Administered 2021-04-15: 162 mg via ORAL
  Filled 2021-04-15: qty 2

## 2021-04-15 MED ORDER — NITROGLYCERIN 0.4 MG SL SUBL: 0.4000 mg | SUBLINGUAL_TABLET | SUBLINGUAL | Status: DC | PRN

## 2021-04-15 NOTE — ED Provider Notes (Signed)
[facility] EMERGENCY DEPT Provider Note   CSN: [id number] Arrival date & time: 04/15/21  0957     History Chief Complaint  Patient presents with   Chest Pain    Joel Berger is a 60 y.o. male.  Patient with onset of intermittent left anterior chest pain pressure for the past 3 days.  Currently now just a discomfort.  But the pain will increase at times and be that way for 2 hours periods better at night.  It is worse with activity.  No shortness of breath no nausea.  No leg swelling.  Patient does have history of coronary artery disease status post CABG 6 years ago.  Followed by Golden Hurter from cardiology.  Also has diabetes hypertension hyperlipidemia.  She does not normally get chest pain.  Patient does not have nitroglycerin at home.  Did take 2 baby aspirin this morning.      Past Medical History:  Diagnosis Date   CAD (coronary artery disease)    a. late presenting Inf MI >> LHC with 3v CAD >> s/p CABG with L-LAD, S-D1, S-PDA.   Carotid stenosis    a. Carotid US 8/16:  bilat ICA 1-39%   Diabetes mellitus    Dyslipidemia, goal LDL below 70 04/29/2015   History of echocardiogram    a. Echo 8/16:  EF 50-55%, inf AK, mild MR   Hypertension    Postoperative atrial fibrillation (HCC)    a. post CABG AF >> converted to NSR with Amiodarone    Patient Active Problem List   Diagnosis Date Noted   Unstable angina ([place]) 04/15/2021   MI, old 07/28/2016   Carotid artery stenosis 11/02/2015   Dyslipidemia, goal LDL below 70 04/29/2015   Postoperative atrial fibrillation ([place]) 03/08/2015   Coronary artery disease involving native coronary artery of native heart without angina pectoris    S/P CABG x 3 03/03/2015   Essential hypertension 03/01/2015   Diabetes mellitus type 2 in nonobese ([place]) 03/01/2015    Past Surgical History:  Procedure Laterality Date   CARDIAC CATHETERIZATION N/A 03/01/2015   Procedure: Left Heart Cath and Coronary Angiography;  Surgeon:  Belva Crome, MD;  Location: [facility];  Service: Cardiovascular;  Laterality: N/A;   CORONARY ARTERY BYPASS GRAFT N/A 03/03/2015   Procedure: CORONARY ARTERY BYPASS GRAFTING (CABG), ON PUMP, TIMES THREE, USING LEFT INTERNAL MAMMARY ARTERY, RIGHT GREATER SAPHENOUS VEIN HARVESTED ENDOSCOPICALLY;  Surgeon: Ivin Poot, MD;  Location: [facility];  Service: Open Heart Surgery;  Laterality: N/A;  LIMA TO LAD, SVG TO DIAGONAL, SVG TO PDA   TEE WITHOUT CARDIOVERSION N/A 03/03/2015   Procedure: TRANSESOPHAGEAL ECHOCARDIOGRAM (TEE);  Surgeon: Ivin Poot, MD;  Location: [facility];  Service: Open Heart Surgery;  Laterality: N/A;       Family History  Problem Relation Age of Onset   Heart failure Mother    Heart attack Father    Colon polyps Paternal Uncle    Heart attack Brother    Colon cancer Neg Hx     Social History   Tobacco Use   Smoking status: Never   Smokeless tobacco: Never  Substance Use Topics   Alcohol use: Yes    Comment: OCCASIONAL BEER,ONCE WEEK   Drug use: No    Home Medications Prior to Admission medications   Medication Sig Start Date End Date Taking? Authorizing Provider  aspirin EC 81 MG tablet Take 81 mg by mouth daily. Swallow whole.   Yes [provider]  ezetimibe (ZETIA) 10 MG tablet TAKE 1 TABLET BY MOUTH EVERY DAY 06/03/20  Yes Turner, Traci R, MD  FARXIGA 10 MG TABS tablet Take 10 mg by mouth daily. 03/29/21  Yes [provider]  glipiZIDE (GLUCOTROL) 10 MG tablet Take 10 mg by mouth Twice daily.  11/11/11  Yes [provider]  lisinopril (ZESTRIL) 2.5 MG tablet Take 1 tablet (2.5 mg total) by mouth daily. 04/20/20  Yes Turner, Eber Hong, MD  metFORMIN (GLUCOPHAGE) 1000 MG tablet Take 1,000 mg by mouth Twice daily.  11/11/11  Yes [provider]  metoprolol tartrate (LOPRESSOR) 25 MG tablet Take 0.5 tablets (12.5 mg total) by mouth 2 (two) times daily. Must keep 05/20/21 appointment for further refills 03/12/21  Yes Turner,  Eber Hong, MD  naproxen sodium (ALEVE) 220 MG tablet Take 220 mg by mouth daily as needed.   Yes [provider]  pioglitazone (ACTOS) 15 MG tablet Take 45 mg by mouth daily.   Yes [provider]  REPATHA SURECLICK 829 MG/ML SOAJ INJECT 140 MG INTO THE SKIN EVERY 14 (FOURTEEN) DAYS. 11/09/20  Yes Turner, Eber Hong, MD  ACCU-CHEK AVIVA PLUS test strip 1 each by Other route as directed.  09/28/11   [provider]    Allergies    Codeine and Phenergan [promethazine hcl]  Review of Systems   Review of Systems  Constitutional:  Negative for chills and fever.  HENT:  Negative for ear pain and sore throat.   Eyes:  Negative for pain and visual disturbance.  Respiratory:  Negative for cough and shortness of breath.   Cardiovascular:  Positive for chest pain. Negative for palpitations.  Gastrointestinal:  Negative for abdominal pain and vomiting.  Genitourinary:  Negative for dysuria and hematuria.  Musculoskeletal:  Negative for arthralgias and back pain.  Skin:  Negative for color change and rash.  Neurological:  Negative for seizures and syncope.  All other systems reviewed and are negative.  Physical Exam Updated Vital Signs BP 124/69   Pulse 63   Temp 97.8 F (36.6 C)   Resp 15   Ht 1.829 m (6')   Wt 95.3 kg   SpO2 97%   BMI 28.48 kg/m   Physical Exam Vitals and nursing note reviewed.  Constitutional:      General: He is not in acute distress.    Appearance: He is well-developed.  HENT:     Head: Normocephalic and atraumatic.  Eyes:     Extraocular Movements: Extraocular movements intact.     Conjunctiva/sclera: Conjunctivae normal.     Pupils: Pupils are equal, round, and reactive to light.  Cardiovascular:     Rate and Rhythm: Normal rate and regular rhythm.     Heart sounds: No murmur heard. Pulmonary:     Effort: Pulmonary effort is normal. No respiratory distress.     Breath sounds: Normal breath sounds. No wheezing or rales.     Comments:  Well-healed median sternotomy scar. Chest:     Chest wall: No tenderness.  Abdominal:     Palpations: Abdomen is soft.     Tenderness: There is no abdominal tenderness.  Musculoskeletal:        General: No swelling.     Cervical back: Normal range of motion and neck supple.  Skin:    General: Skin is warm and dry.  Neurological:     General: No focal deficit present.     Mental Status: He is alert and oriented to person,  place, and time.     Cranial Nerves: No cranial nerve deficit.     Sensory: No sensory deficit.     Motor: No weakness.    ED Results / Procedures / Treatments   Labs (all labs ordered are listed, but only abnormal results are displayed) Labs Reviewed  BASIC METABOLIC PANEL - Abnormal; Notable for the following components:      Result Value   Glucose, Bld 142 (*)    All other components within normal limits  RESP PANEL BY RT-PCR (FLU A&B, COVID) ARPGX2  CBC  HEPARIN LEVEL (UNFRACTIONATED)  TROPONIN I (HIGH SENSITIVITY)  TROPONIN I (HIGH SENSITIVITY)    EKG EKG Interpretation  Date/Time:  Thursday April 15 2021 10:04:54 EDT Ventricular Rate:  69 PR Interval:  166 QRS Duration: 86 QT Interval:  390 QTC Calculation: 417 R Axis:   85 Text Interpretation: Sinus rhythm with Premature atrial complexes with Abberant conduction Otherwise normal ECG Confirmed by Fredia Sorrow [phone]) on 04/15/2021 10:50:48 AM  Radiology DG Chest Port 1 View  Result Date: 04/15/2021 CLINICAL DATA:  Chest pain. EXAM: PORTABLE CHEST 1 VIEW COMPARISON:  February 27, 2015. FINDINGS: Stable cardiomediastinal silhouette. Status post coronary bypass graft. Lungs are clear. Bony thorax is unremarkable. IMPRESSION: No active disease. Electronically Signed   By: Marijo Conception M.D.   On: 04/15/2021 11:02    Procedures Procedures   Medications Ordered in ED Medications  nitroGLYCERIN (NITROSTAT) SL tablet 0.4 mg (0.4 mg Sublingual Given 04/15/21 1119)  heparin bolus via  infusion 4,000 Units (has no administration in time range)  heparin ADULT infusion 100 units/mL (25000 units/272mL) (has no administration in time range)  aspirin chewable tablet 162 mg (162 mg Oral Given 04/15/21 1118)    ED Course  I have reviewed the triage vital signs and the nursing notes.  Pertinent labs & imaging results that were available during my care of the patient were reviewed by me and considered in my medical decision making (see chart for details).    MDM Rules/Calculators/A&P                         CRITICAL CARE Performed by: Fredia Sorrow Total critical care time: 35 minutes Critical care time was exclusive of separately billable procedures and treating other patients. Critical care was necessary to treat or prevent imminent or life-threatening deterioration. Critical care was time spent personally by me on the following activities: development of treatment plan with patient and/or surrogate as well as nursing, discussions with consultants, evaluation of patient's response to treatment, examination of patient, obtaining history from patient or surrogate, ordering and performing treatments and interventions, ordering and review of laboratory studies, ordering and review of radiographic studies, pulse oximetry and re-evaluation of patient's condition.   Patient does not normally get chest pain.  Is now had 3 days of intermittent chest pain.  Could be unstable angina picture.  Initial troponin was 5.  White blood cell count normal hemoglobin normal basic metabolic panel without any acute abnormalities.  Renal function normal.  Sugar 142.  EKG without any significant changes.  Chest x-ray portable results pending.  We will go ahead and add on COVID testing.  Patient most likely will need admission.  We will try sublingual nitro.  Given 2 more baby aspirin's.  Then will discuss with cardiology.  Discussed with Dr. Percival [facility] cardiology.  They will admit for unstable  angina.  I will start him on heparin.  Patient is not totally pain-free but almost completely pain-free.  Final Clinical Impression(s) / ED Diagnoses Final diagnoses:  Unstable angina [facility])    Rx / DC Orders ED Discharge Orders     None        Fredia Sorrow, MD 04/15/21 1255

## 2021-04-15 NOTE — Plan of Care (Signed)
  Problem: Activity: Goal: Risk for activity intolerance will decrease Outcome: Progressing   Problem: Pain Managment: Goal: General experience of comfort will improve Outcome: Progressing   

## 2021-04-15 NOTE — Telephone Encounter (Signed)
The patient stated he noticed his blood pressure running higher on 04/13/21. Has not missed any medications.   10/11 177/100 67 161/97 69  10/12 177/92 71 153/90 71  10/13 168/87 74 Current BP  170/98 58  Dull pain/weight, left of center of chest, start 3-4 days ago. No nausea,  vomiting, shortness of breath, or sweating. More noticeable in the morning and eases off through out the day. Maybe with physical activity because he is more active in the morning. No PRN nitro to take.  Hx of 3 v CAD s/p CABG, 2016. Pain rating at a 6. Advised patient to go to the ER for a cardiac workup. Have someone else drive him or call [phone].  Verbalized understanding and agreement.  Will forward to Dr. Radford Pax to follow.

## 2021-04-15 NOTE — Telephone Encounter (Signed)
Pt c/o BP issue: STAT if pt c/o blurred vision, one-sided weakness or slurred speech  1. What are your last 5 BP readings?  04/14/21: 190/100 04/15/21: 168/87  2. Are you having any other symptoms (ex. Dizziness, headache, blurred vision, passed out)? Angina, dizziness, blurry vision  3. What is your BP issue? Patient wanted to know if he should be seen sooner   Patient states he is on [place] and he has had blurry vision for ~3-4 weeks. He got steroid injections in his eyes a week ago.

## 2021-04-15 NOTE — Progress Notes (Signed)
ANTICOAGULATION CONSULT NOTE - Initial Consult  Pharmacy Consult for heparin Indication: chest pain/ACS  Allergies  Allergen Reactions   Codeine Rash   Phenergan [Promethazine Hcl] Anxiety    Patient becomes restless & anxious    Patient Measurements: Height: 6' (182.9 cm) Weight: 95.3 kg (210 lb) IBW/kg (Calculated) : 77.6 Heparin Dosing Weight: 95.3 kg  Vital Signs: Temp: 97.8 F (36.6 C) (10/13 1004) BP: 124/69 (10/13 1200) Pulse Rate: 63 (10/13 1200)  Labs: Recent Labs    04/15/21 1014  HGB 16.0  HCT 46.4  PLT 198  CREATININE 0.66  TROPONINIHS 5    Estimated Creatinine Clearance: 117.6 mL/min (by C-G formula based on SCr of 0.66 mg/dL).   Medical History: Past Medical History:  Diagnosis Date   CAD (coronary artery disease)    a. late presenting Inf MI >> LHC with 3v CAD >> s/p CABG with L-LAD, S-D1, S-PDA.   Carotid stenosis    a. Carotid US 8/16:  bilat ICA 1-39%   Diabetes mellitus    Dyslipidemia, goal LDL below 70 04/29/2015   History of echocardiogram    a. Echo 8/16:  EF 50-55%, inf AK, mild MR   Hypertension    Postoperative atrial fibrillation (HCC)    a. post CABG AF >> converted to NSR with Amiodarone    Medications: see MAR  Assessment: 60 yo M with heparin consult for ACS/STEMI. Cardiology consulted. CBC stable. No AC PTA.  Goal of Therapy:  Heparin level 0.3-0.7 units/ml Monitor platelets by anticoagulation protocol: Yes   Plan:  Give 4000 units bolus x 1 Start heparin infusion at 1350 units/hr Check anti-Xa level in 6 hours and daily while on heparin Continue to monitor H&H and platelets  Joetta Manners, PharmD, [facility] Emergency Medicine Clinical Pharmacist ED RPh Phone: [place]: [phone]

## 2021-04-15 NOTE — H&P (Addendum)
Cardiology Admission History and Physical:   Patient ID: Joel Berger MRN: [id number]; DOB: Jul 07, 1960   Admission date: 04/15/2021  PCP:  Joel Berger., MD   [facility] Providers Cardiologist:  Joel Him, MD       Chief Complaint:  chest pain  Patient Profile:   Joel Berger is a 60 y.o. male with CAD (late presenting inferior MI 02/2015 with 3V CAD s/p CABG with LIMA-LAD, SVG-D1, SVG-PDA, ischemic cardiomyopathy 45-50%, post-op atrial fib isolated to CABG admission, mild carotid artery disease (1-39% RICA by last duplex 01/2019), DM, dyslipidemia with statin intolerance on Zetia/Repatha, HTN who is being seen 04/15/2021 for the evaluation of chest discomfort, elevated BP, and back pain.  History of Present Illness:   Joel Berger has the above medical history and has done well since CABG. He wore a monitor 05/2015 showing SB, NSR, sinus tach range 53-123bpm, occasional PACs/PVCs (very few per report). Last echo 05/2015 showed EF 45-50% with severe hypokinesis of the basal-midinferior and inferoseptal myocardium, restrictive physiology indicative of decreased left ventricular diastolic compliance and/or increased left atrial pressure, mild-moderate MR, mild LAE, mild RVE, PASP 59mmHg. He has not required repeat ischemic testing since bypass.  He is accompanied by his wife today. About 3 weeks ago he began to develop right sided low back pain which he describes as "kidney pain." He initially attributed this to possible kidney stones as he'd had before but it has not resolved and he did not pass anything. He saw primary care who added Farxiga and naproxen. He does not recall having any specific workup otherwise. Since that time, more often than not, the pain has begun waking up Berger in the middle of the night as well. He has been taking naproxen 2 tablets BID but still with breakthrough pain. During this timeframe he also began seeing a retinal specialist for some blurry vision and he  received a steroid injection in his eye last week. Hiodically he would have facial flushing or dizziness and when they'd check his BP it would be much higher than normal, 150s-160s/80s-100s. Over the last week he has begun noticing episodic left sided dull chest discomfort - this can happen both at rest and with exertion, such as taking the trash cans up or going up the steps, but does not happen every time he exerts himself. It can last minutes to hours without specific pattern. It is associated with a feeling that he needs to defecate but can't. He does report a tendency for looser stools. denies any associated SOB, nausea, vomiting. No palpitations, constipation, GI/GU bleeding, orthopnea, or edema. His wife has been urging Berger to seek care for this issue so he called our office and was advised to go to the ED. He went to [facility] where labs showed mildly elevated glucose 142, normal CBC, normal troponin, negative Covid panel. CXR NAD. He received 162mg  ASA, heparin bolus and drip, and SL NTG x1. He was hypertensive on arrival at 178/85 but improved and BP has been normal most of the afternoon. EKG does not show any acute STT changes. With his prior MI he had lots of nausea and belching which isn't the case with this presentation. He does not have any acute symptoms at present time.   Past Medical History:  Diagnosis Date   CAD (coronary artery disease)    a. late presenting Inf MI >> LHC with 3v CAD >> s/p CABG with L-LAD, S-D1, S-PDA.   Diabetes mellitus    Dyslipidemia, goal  LDL below 70 04/29/2015   Hypertension    Ischemic cardiomyopathy    Mild carotid artery disease (HCC)    Mitral regurgitation    Postoperative atrial fibrillation (HCC)    a. post CABG AF >> converted to NSR with Amiodarone    Past Surgical History:  Procedure Laterality Date   CARDIAC CATHETERIZATION N/A 03/01/2015   Procedure: Left Heart Cath and Coronary Angiography;  Surgeon: Joel Crome, MD;  Location: [facility] [facility];  Service: Cardiovascular;  Laterality: N/A;   CORONARY ARTERY BYPASS GRAFT N/A 03/03/2015   Procedure: CORONARY ARTERY BYPASS GRAFTING (CABG), ON PUMP, TIMES THREE, USING LEFT INTERNAL MAMMARY ARTERY, RIGHT GREATER SAPHENOUS VEIN HARVESTED ENDOSCOPICALLY;  Surgeon: Joel Poot, MD;  Location: [facility];  Service: Open Heart Surgery;  Laterality: N/A;  LIMA TO LAD, SVG TO DIAGONAL, SVG TO PDA   TEE WITHOUT CARDIOVERSION N/A 03/03/2015   Procedure: TRANSESOPHAGEAL ECHOCARDIOGRAM (TEE);  Surgeon: Joel Poot, MD;  Location: [facility];  Service: Open Heart Surgery;  Laterality: N/A;     Medications Prior to Admission: Prior to Admission medications   Medication Sig Start Date End Date Taking? Authorizing Provider  aspirin EC 81 MG tablet Take 81 mg by mouth daily. Swallow whole.   Yes [provider]  ezetimibe (ZETIA) 10 MG tablet TAKE 1 TABLET BY MOUTH EVERY DAY 06/03/20  Yes Berger, Joel R, MD  FARXIGA 10 MG TABS tablet Take 10 mg by mouth daily. 03/29/21  Yes [provider]  glipiZIDE (GLUCOTROL) 10 MG tablet Take 10 mg by mouth Twice daily.  11/11/11  Yes [provider]  lisinopril (ZESTRIL) 2.5 MG tablet Take 1 tablet (2.5 mg total) by mouth daily. 04/20/20  Yes Berger, Joel Hong, MD  metFORMIN (GLUCOPHAGE) 1000 MG tablet Take 1,000 mg by mouth Twice daily.  11/11/11  Yes [provider]  metoprolol tartrate (LOPRESSOR) 25 MG tablet Take 0.5 tablets (12.5 mg total) by mouth 2 (two) times daily. Must keep 05/20/21 appointment for further refills 03/12/21  Yes Berger, Joel Hong, MD  naproxen sodium (ALEVE) 220 MG tablet Take 220 mg by mouth daily as needed.   Yes [provider]  pioglitazone (ACTOS) 15 MG tablet Take 45 mg by mouth daily.   Yes [provider]  REPATHA SURECLICK 469 MG/ML SOAJ INJECT 140 MG INTO THE SKIN EVERY 14 (FOURTEEN) DAYS. 11/09/20  Yes Berger, Joel Hong, MD  ACCU-CHEK AVIVA PLUS test strip 1 each by Other route as  directed.  09/28/11   [provider]     Allergies:    Allergies  Allergen Reactions   Codeine Rash   Phenergan [Promethazine Hcl] Anxiety    Patient becomes restless & anxious    Social History:   Social History   Socioeconomic History   Marital status: Single    Spouse name: Not on file   Number of children: Not on file   Years of education: Not on file   Highest education level: Not on file  Occupational History   Not on file  Tobacco Use   Smoking status: Never   Smokeless tobacco: Never  Substance and Sexual Activity   Alcohol use: Yes    Comment: OCCASIONAL BEER,ONCE - TWICE WEEKLY   Drug use: No   Sexual activity: Not on file  Other Topics Concern   Not on file  Social History Narrative   Not on file   Social Determinants of Health   Financial Resource Strain: Not  on file  Food Insecurity: Not on file  Transportation Needs: Not on file  Physical Activity: Not on file  Stress: Not on file  Social Connections: Not on file  Intimate Partner Violence: Not on file    Family History:   The patient's family history includes Colon polyps in his paternal uncle; Heart attack in his brother and father; Heart failure in his mother. There is no history of Colon cancer.    ROS:  Please see the history of present illness.  All other ROS reviewed and negative.     Physical Exam/Data:   Vitals:   04/15/21 1330 04/15/21 1400 04/15/21 1500 04/15/21 1506  BP: 133/72 128/68  127/68  Pulse: 61 67  65  Resp: 16 19  20   Temp:    98.4 F (36.9 C)  TempSrc:    Oral  SpO2: 100% 99%  100%  Weight:   92.8 kg   Height:   6' (1.829 m)    No intake or output data in the 24 hours ending 04/15/21 1620 Last 3 Weights 04/15/2021 04/15/2021 04/20/2020  Weight (lbs) 204 lb 9.4 oz 210 lb 221 lb  Weight (kg) 92.8 kg 95.255 kg 100.245 kg     Body mass index is 27.75 kg/m.  General: Well developed, well nourished WM, in no acute distress. Head: Normocephalic,  atraumatic, sclera non-icteric, no xanthomas, nares are without discharge. Neck: Negative for carotid bruits. JVP not elevated. Lungs: Clear bilaterally to auscultation without wheezes, rales, or rhonchi. Breathing is unlabored. Heart: RRR S1 S2 without murmurs, rubs, or gallops.  Abdomen: Soft, non-tender, non-distended with normoactive bowel sounds. No rebound/guarding. Extremities: No clubbing or cyanosis. No edema. Distal pedal pulses are 2+ and equal bilaterally. Neuro: Alert and oriented X 3. Moves all extremities spontaneously. Psych:  Responds to questions appropriately with a normal affect.   EKG:  EKGs personally reviewed: 1) NSR 69bpm, baseline wander/artifact, occasional PAC, baseline wander/artifact degrades interpretation but otherwise no acute ST changes 2) NSR 62bpm, no acute STT changes  Relevant CV Studies: 2D echo 05/2015 - Left ventricle: The cavity size was mildly dilated. Systolic    function was mildly reduced. The estimated ejection fraction was    in the range of 45% to 50%. Severe hypokinesis of the    basal-midinferior and inferoseptal myocardium; consistent with    infarction in the distribution of the right coronary artery.    Doppler parameters are consistent with restrictive physiology,    indicative of decreased left ventricular diastolic compliance    and/or increased left atrial pressure.  - Ventricular septum: Septal motion showed paradox.  - Mitral valve: There was mild to moderate regurgitation directed    centrally.  - Left atrium: The atrium was mildly dilated.  - Right ventricle: The cavity size was mildly dilated.  - Pulmonary arteries: PA peak pressure: 32 mm Hg (S).   Monitor 05/2015 Sinus bradycardia, Normal Sinus Rhythm and Sinus tachycardia with heart rates ranging from 53 to 123 bpm Occasional PACs Occasional PVCs  Cardiac Cath 02/2015 Mid RCA to Dist RCA lesion, 75% stenosed. 1st Mrg lesion, 40% stenosed. Prox LAD to Mid LAD  lesion, 90% stenosed. 1st Diag lesion, 70% stenosed. Post Atrio lesion, 100% stenosed.   Late presenting inferior ST elevation infarction, arriving 12 hours after symptom onset with initial troponin of 20. EKG revealing evolutionary inferior ST and T-wave abnormality. Recurrent post-infarct pain, leading to coronary angiography and the following findings: Culprit total occlusion of the distal right coronary beyond  the large dominant PDA (right dominant anatomy); distal right coronary with eccentric 50-80% stenosis (worse in RAO view); 90% prox- mid LAD forming a bifurcation Ladona Mow 0,0,1)   with large diagonal #1, which also contains mid 60-80% stenosis; and luminal irregularities up to 40% in the obtuse marginal. Inferobasal akinesis. EF 45-50%      RECOMMENDATIONS:   Heart team approach. Consider surgical revascularization with LIMA to LAD, and also grafting of the large diagonal and the PDA. An alternative approach would be percutaneous coronary intervention, however the LAD bifurcation lesion runs a risk of future ischemic complications in the diagonal territory both acutely and long-term.      Laboratory Data:  High Sensitivity Troponin:   Recent Labs  Lab 04/15/21 1014 04/15/21 1214  TROPONINIHS 5 5      Chemistry Recent Labs  Lab 04/15/21 1014  NA 139  K 3.8  CL 101  CO2 28  GLUCOSE 142*  BUN 19  CREATININE 0.66  CALCIUM 9.4  GFRNONAA >60  ANIONGAP 10    Hematology Recent Labs  Lab 04/15/21 1014  WBC 4.7  RBC 5.45  HGB 16.0  HCT 46.4  MCV 85.1  MCH 29.4  MCHC 34.5  RDW 13.0  PLT 198   Thyroid No results for input(s): TSH, FREET4 in the last 168 hours. BNPNo results for input(s): BNP, PROBNP in the last 168 hours.  DDimer No results for input(s): DDIMER in the last 168 hours.   Radiology/Studies:  DG Chest Port 1 View  Result Date: 04/15/2021 CLINICAL DATA:  Chest pain. EXAM: PORTABLE CHEST 1 VIEW COMPARISON:  February 27, 2015. FINDINGS: Stable  cardiomediastinal silhouette. Status post coronary bypass graft. Lungs are clear. Bony thorax is unremarkable. IMPRESSION: No active disease. Electronically Signed   By: Marijo Conception M.D.   On: 04/15/2021 11:02     Assessment and Plan:   1. Chest pain with mixed features - some features suggestive of unstable angina (i.e. chest discomfort over the last week when taking trash cans out), but also some atypical components as well as he is sometimes able to do these activities without any discomfort - EKG shows NSR without acute changes and troponins negative - will discuss further workup with MD and continuation of present medical therapy (started on heparin at OSH)  2. CAD s/p CABG - continue ASA, BB, Zetia, Repatha (statin intolerant) - f/u lipids/CMET in AM   3. Right lower back pain - concerning to me in that it has begun to wake Berger out of sleep most nights   - no focal physical exam findings - no dysuria, hematuria - check UA - will discuss further imaging with Dr. Percival Spanish - will order Tylenol and tramadol PRN since we are stopping NSAIDS (confirmed with pharmD OK to take tramadol with codeine allergy)  4. HTN with recent elevation in BP - ?incited by recent naproxen use - would hold this for now - check thyroid - follow BP on home regimen and titrate as necessary  5. Ischemic cardiomyopathy - no evidence of volume overload on exam - recheck echocardiogram - continue low dose lisinopril, metoprolol, titrate meds as needed  6. Remote post-op afib at time of CABG - monitor in 2016 showed predominantly NSR, PACs, PVCs - maintaining NSR  7. Diabetes mellitus - per patient, A1C recently elevated - will recheck tomorrow - hold metformin, glipizide until procedures complete as he may need to be NPO - hold Actos - less ideal choice given LV dysfunction -  hold Farxiga until UTI ruled out - add SSI - continue to follow with ophthalmology/PCP   Risk Assessment/Risk Scores:     TIMI Risk Score for Unstable Angina or Non-ST Elevation MI:   The patient's TIMI risk score is 3, which indicates a 13% risk of all cause mortality, new or recurrent myocardial infarction or need for urgent revascularization in the next 14 days.  New York Heart Association (NYHA) Functional Class NYHA Class I-II  Remote hx PAF periooperatively. CHA2DS2-VASc Score = 4   This indicates a 4.8% annual risk of stroke. The patient's score is based upon: CHF History: 1 HTN History: 1 Diabetes History: 1 Stroke History: 0 Vascular Disease History: 1 Age Score: 0 Gender Score: 0     Severity of Illness: The appropriate patient status for this patient is OBSERVATION. Observation status is judged to be reasonable and necessary in order to provide the required intensity of service to ensure the patient's safety. The patient's presenting symptoms, physical exam findings, and initial radiographic and laboratory data in the context of their medical condition is felt to place them at decreased risk for further clinical deterioration. Furthermore, it is anticipated that the patient will be medically stable for discharge from the hospital within 2 midnights of admission.    For questions or updates, please contact [facility] Please consult [email or web address] for contact info under     Signed, Charlie Pitter, PA-C  04/15/2021 4:20 PM   History and all data above reviewed.  Patient examined.  I agree with the findings as above.  The patient called the office because his blood pressure was very elevated.  His wife had Berger take it because he was looking flushed.  He has been having problems like issues with his eyes recently with increased pressures and had to have this addressed.  He has had flank pain on the right side that he thinks is reminiscent of previous kidney stones.  He has not had any hematuria pyuria or dysuria.   He was told in the office to go to the emergency room because of his blood  pressure and there he mentioned having some chest discomfort.  This is not like his previous angina.  He has some vague discomfort.  He just noticed this today.  He is really not been having any chest discomfort otherwise not been overly physically active he does his chores of daily living and has not been bringing this on.  He is not otherwise been having any shortness of breath, PND or orthopnea.  Is not having any palpitations, presyncope or syncope.  the patient exam reveals COR: Regular rate and rhythm, no murmurs, no rubs,  Lungs: Clear to auscultation bilaterally,  Abd: Positive bowel sounds normal in frequency and pitch, bruits, rebound, guarding, Ext 2+ pulses, no edema..  All available labs, radiology testing, previous records reviewed. Agree with documented assessment and plan.  Chest pain: His chest pain has nonanginal greater than anginal features.   He has otherwise been well.  I think the pretest probability of obstructive coronary disease is low.  I think I would like to screen Berger with a POET (Plain Old Exercise Treadmill) provided his echo and his enzymes are unremarkable.   Flank pain: We will order a CT with protocol to rule out kidney stones.  Hypertension: His blood pressure is back to baseline and we will continue his previous meds and follow with med adjustments as needed.  Joel Berger  5:38 PM  04/15/2021

## 2021-04-15 NOTE — ED Notes (Signed)
CBG 84 reported to [facility]

## 2021-04-15 NOTE — ED Triage Notes (Signed)
Left chest pain , Hx HTN, denies shortness of breath . Denies NV.

## 2021-04-15 NOTE — Progress Notes (Addendum)
ANTICOAGULATION CONSULT NOTE - Initial Consult  Pharmacy Consult for heparin Indication: chest pain/ACS  Allergies  Allergen Reactions   Codeine Rash   Phenergan [Promethazine Hcl] Anxiety    Patient becomes restless & anxious    Patient Measurements: Height: 6' (182.9 cm) Weight: 92.8 kg (204 lb 9.4 oz) IBW/kg (Calculated) : 77.6 Heparin Dosing Weight: 95.3 kg  Vital Signs: Temp: 98.4 F (36.9 C) (10/13 1506) Temp Source: Oral (10/13 1506) BP: 127/68 (10/13 1506) Pulse Rate: 65 (10/13 1506)  Labs: Recent Labs    04/15/21 1014 04/15/21 1214 04/15/21 1920  HGB 16.0  --   --   HCT 46.4  --   --   PLT 198  --   --   HEPARINUNFRC  --   --  <0.10*  CREATININE 0.66  --   --   TROPONINIHS 5 5  --      Estimated Creatinine Clearance: 107.8 mL/min (by C-G formula based on SCr of 0.66 mg/dL).   Medical History: Past Medical History:  Diagnosis Date   CAD (coronary artery disease)    a. late presenting Inf MI >> LHC with 3v CAD >> s/p CABG with L-LAD, S-D1, S-PDA.   Diabetes mellitus    Dyslipidemia, goal LDL below 70 04/29/2015   Hypertension    Ischemic cardiomyopathy    Mild carotid artery disease (HCC)    Mitral regurgitation    Postoperative atrial fibrillation (HCC)    a. post CABG AF >> converted to NSR with Amiodarone    Medications: see MAR  Assessment: 59 yo M with heparin consult for ACS/STEMI. Cardiology consulted. CBC stable. No AC PTA.  Heparin level undetectable on heparin 1350 units/hr. Per RN no issues with IV infusion or access. No bleeding noted.   Goal of Therapy:  Heparin level 0.3-0.7 units/ml Monitor platelets by anticoagulation protocol: Yes   Plan:  Heparin 2700 unit bolus  Increase heparin to 1650 units/hr  Check 6 hr heparin level  Follow up cardiology work up and anticoagulation plans.   Cristela Felt, PharmD, BCPS Clinical Pharmacist 04/15/2021 8:04 PM  Addendum: RN now reporting IV access infiltrated which she noted when  giving heparin bolus (stopped per RN). Consult placed to IV team to obtain access. Concern that previous heparin level was falsely low.   Plan (discussed with RN): - resume heparin at previous rate of heparin 1350 units/hr and no heparin bolus - check 6 hr heparin level when new IV access obtained and heparin infusion resumed

## 2021-04-16 ENCOUNTER — Observation Stay (HOSPITAL_COMMUNITY): Payer: BC Managed Care – PPO

## 2021-04-16 ENCOUNTER — Encounter (HOSPITAL_COMMUNITY)
Admission: EM | Disposition: A | Discharge status: Home / Self Care | Payer: Self-pay | Source: Home / Self Care | Attending: Cardiology

## 2021-04-16 ENCOUNTER — Encounter (HOSPITAL_COMMUNITY): Payer: Self-pay | Admitting: Cardiology

## 2021-04-16 ENCOUNTER — Encounter (HOSPITAL_COMMUNITY): Payer: BC Managed Care – PPO

## 2021-04-16 DIAGNOSIS — I252 Old myocardial infarction: Secondary | ICD-10-CM | POA: Diagnosis not present

## 2021-04-16 DIAGNOSIS — Z79899 Other long term (current) drug therapy: Secondary | ICD-10-CM | POA: Diagnosis not present

## 2021-04-16 DIAGNOSIS — R079 Chest pain, unspecified: Secondary | ICD-10-CM

## 2021-04-16 DIAGNOSIS — Z20822 Contact with and (suspected) exposure to covid-19: Secondary | ICD-10-CM | POA: Diagnosis present

## 2021-04-16 DIAGNOSIS — I083 Combined rheumatic disorders of mitral, aortic and tricuspid valves: Secondary | ICD-10-CM | POA: Diagnosis present

## 2021-04-16 DIAGNOSIS — I25810 Atherosclerosis of coronary artery bypass graft(s) without angina pectoris: Secondary | ICD-10-CM | POA: Diagnosis not present

## 2021-04-16 DIAGNOSIS — I25110 Atherosclerotic heart disease of native coronary artery with unstable angina pectoris: Secondary | ICD-10-CM | POA: Diagnosis present

## 2021-04-16 DIAGNOSIS — Z23 Encounter for immunization: Secondary | ICD-10-CM | POA: Diagnosis not present

## 2021-04-16 DIAGNOSIS — Z888 Allergy status to other drugs, medicaments and biological substances status: Secondary | ICD-10-CM | POA: Diagnosis not present

## 2021-04-16 DIAGNOSIS — I255 Ischemic cardiomyopathy: Secondary | ICD-10-CM | POA: Diagnosis present

## 2021-04-16 DIAGNOSIS — I10 Essential (primary) hypertension: Secondary | ICD-10-CM | POA: Diagnosis present

## 2021-04-16 DIAGNOSIS — E785 Hyperlipidemia, unspecified: Secondary | ICD-10-CM | POA: Diagnosis present

## 2021-04-16 DIAGNOSIS — E119 Type 2 diabetes mellitus without complications: Secondary | ICD-10-CM | POA: Diagnosis present

## 2021-04-16 DIAGNOSIS — Z8249 Family history of ischemic heart disease and other diseases of the circulatory system: Secondary | ICD-10-CM | POA: Diagnosis not present

## 2021-04-16 DIAGNOSIS — K8020 Calculus of gallbladder without cholecystitis without obstruction: Secondary | ICD-10-CM | POA: Diagnosis present

## 2021-04-16 DIAGNOSIS — I700 Atherosclerosis of aorta: Secondary | ICD-10-CM | POA: Diagnosis present

## 2021-04-16 DIAGNOSIS — Z833 Family history of diabetes mellitus: Secondary | ICD-10-CM | POA: Diagnosis not present

## 2021-04-16 DIAGNOSIS — I97190 Other postprocedural cardiac functional disturbances following cardiac surgery: Secondary | ICD-10-CM | POA: Diagnosis not present

## 2021-04-16 DIAGNOSIS — E876 Hypokalemia: Secondary | ICD-10-CM | POA: Diagnosis not present

## 2021-04-16 DIAGNOSIS — Y838 Other surgical procedures as the cause of abnormal reaction of the patient, or of later complication, without mention of misadventure at the time of the procedure: Secondary | ICD-10-CM | POA: Diagnosis not present

## 2021-04-16 DIAGNOSIS — Z7984 Long term (current) use of oral hypoglycemic drugs: Secondary | ICD-10-CM | POA: Diagnosis not present

## 2021-04-16 DIAGNOSIS — Y92239 Unspecified place in hospital as the place of occurrence of the external cause: Secondary | ICD-10-CM | POA: Diagnosis not present

## 2021-04-16 DIAGNOSIS — I200 Unstable angina: Secondary | ICD-10-CM | POA: Diagnosis present

## 2021-04-16 DIAGNOSIS — R81 Glycosuria: Secondary | ICD-10-CM | POA: Diagnosis present

## 2021-04-16 DIAGNOSIS — I25700 Atherosclerosis of coronary artery bypass graft(s), unspecified, with unstable angina pectoris: Secondary | ICD-10-CM | POA: Diagnosis present

## 2021-04-16 DIAGNOSIS — Z885 Allergy status to narcotic agent status: Secondary | ICD-10-CM | POA: Diagnosis not present

## 2021-04-16 DIAGNOSIS — I2510 Atherosclerotic heart disease of native coronary artery without angina pectoris: Secondary | ICD-10-CM | POA: Diagnosis not present

## 2021-04-16 DIAGNOSIS — Z7982 Long term (current) use of aspirin: Secondary | ICD-10-CM | POA: Diagnosis not present

## 2021-04-16 DIAGNOSIS — I6529 Occlusion and stenosis of unspecified carotid artery: Secondary | ICD-10-CM | POA: Diagnosis present

## 2021-04-16 DIAGNOSIS — I459 Conduction disorder, unspecified: Secondary | ICD-10-CM | POA: Diagnosis present

## 2021-04-16 HISTORY — PX: CORONARY PRESSURE/FFR STUDY: CATH118243

## 2021-04-16 HISTORY — PX: LEFT HEART CATH AND CORS/GRAFTS ANGIOGRAPHY: CATH118250

## 2021-04-16 HISTORY — PX: CORONARY STENT INTERVENTION: CATH118234

## 2021-04-16 LAB — LIPID PANEL
Cholesterol: 70 mg/dL (ref 0–200)
HDL: 44 mg/dL (ref >40)
LDL Cholesterol: 2 mg/dL (ref 0–99)
Total CHOL/HDL Ratio: 1.6 RATIO
Triglycerides: 120 mg/dL (ref <150)
VLDL: 24 mg/dL (ref 0–40)

## 2021-04-16 LAB — GLUCOSE, CAPILLARY
Glucose-Capillary: 139 mg/dL — ABNORMAL HIGH (ref 70–99)
Glucose-Capillary: 148 mg/dL — ABNORMAL HIGH (ref 70–99)
Glucose-Capillary: 87 mg/dL (ref 70–99)
Glucose-Capillary: 177 mg/dL — ABNORMAL HIGH (ref 70–99)

## 2021-04-16 LAB — EXERCISE TOLERANCE TEST
Angina Index: 0
Duke Treadmill Score: 9
Estimated workload: 10.1
Exercise duration (min): 8 min
Exercise duration (sec): 58 s
MPHR: 160 {beats}/min
Peak HR: 146 {beats}/min
Percent HR: 91 %
Rest HR: 60 {beats}/min
ST Depression (mm): 0 mm

## 2021-04-16 LAB — COMPREHENSIVE METABOLIC PANEL
ALT: 19 U/L (ref 0–44)
AST: 17 U/L (ref 15–41)
Albumin: 3.7 g/dL (ref 3.5–5.0)
Alkaline Phosphatase: 61 U/L (ref 38–126)
Anion gap: 8 (ref 5–15)
BUN: 14 mg/dL (ref 6–20)
CO2: 26 mmol/L (ref 22–32)
Calcium: 8.9 mg/dL (ref 8.9–10.3)
Chloride: 103 mmol/L (ref 98–111)
Creatinine, Ser: 0.69 mg/dL (ref 0.61–1.24)
GFR, Estimated: >60 mL/min (ref >60)
Glucose, Bld: 158 mg/dL — ABNORMAL HIGH (ref 70–99)
Potassium: 3.4 mmol/L — ABNORMAL LOW (ref 3.5–5.1)
Sodium: 137 mmol/L (ref 135–145)
Total Bilirubin: 0.9 mg/dL (ref 0.3–1.2)
Total Protein: 6.1 g/dL — ABNORMAL LOW (ref 6.5–8.1)

## 2021-04-16 LAB — CBG MONITORING, ED: Glucose-Capillary: 84 mg/dL (ref 70–99)

## 2021-04-16 LAB — CBC
HCT: 41.8 % (ref 39.0–52.0)
Hemoglobin: 14.7 g/dL (ref 13.0–17.0)
MCH: 29.8 pg (ref 26.0–34.0)
MCHC: 35.2 g/dL (ref 30.0–36.0)
MCV: 84.6 fL (ref 80.0–100.0)
Platelets: 172 10*3/uL (ref 150–400)
RBC: 4.94 MIL/uL (ref 4.22–5.81)
RDW: 12.9 % (ref 11.5–15.5)
WBC: 4.5 10*3/uL (ref 4.0–10.5)
nRBC: 0 % (ref 0.0–0.2)

## 2021-04-16 LAB — ECHOCARDIOGRAM COMPLETE
Area-P 1/2: 2.16 cm2
Calc EF: 46.4 %
Height: 72 in
S' Lateral: 3.87 cm
Single Plane A2C EF: 48.7 %
Single Plane A4C EF: 45 %
Weight: 3270.4 oz

## 2021-04-16 LAB — HEMOGLOBIN A1C
Hgb A1c MFr Bld: 8.7 % — ABNORMAL HIGH (ref 4.8–5.6)
Mean Plasma Glucose: 202.99 mg/dL

## 2021-04-16 LAB — TSH: TSH: 3.468 u[IU]/mL (ref 0.350–4.500)

## 2021-04-16 LAB — HIV ANTIBODY (ROUTINE TESTING W REFLEX): HIV Screen 4th Generation wRfx: NONREACTIVE

## 2021-04-16 LAB — HEPARIN LEVEL (UNFRACTIONATED): Heparin Unfractionated: 0.22 IU/mL — ABNORMAL LOW (ref 0.30–0.70)

## 2021-04-16 SURGERY — LEFT HEART CATH AND CORS/GRAFTS ANGIOGRAPHY
Anesthesia: LOCAL

## 2021-04-16 MED ORDER — VERAPAMIL HCL 2.5 MG/ML IV SOLN
INTRAVENOUS | Status: DC | PRN
  Administered 2021-04-16: 10 mL via INTRA_ARTERIAL

## 2021-04-16 MED ORDER — HEPARIN SODIUM (PORCINE) 1000 UNIT/ML IJ SOLN
INTRAMUSCULAR | Status: AC
  Filled 2021-04-16: qty 1

## 2021-04-16 MED ORDER — ENOXAPARIN SODIUM 40 MG/0.4ML IJ SOSY
40.0000 mg | PREFILLED_SYRINGE | INTRAMUSCULAR | Status: DC
  Administered 2021-04-17: 40 mg via SUBCUTANEOUS
  Filled 2021-04-16: qty 0.4

## 2021-04-16 MED ORDER — CLOPIDOGREL BISULFATE 300 MG PO TABS
ORAL_TABLET | ORAL | Status: DC | PRN
  Administered 2021-04-16: 600 mg via ORAL

## 2021-04-16 MED ORDER — HEPARIN (PORCINE) IN NACL 1000-0.9 UT/500ML-% IV SOLN
INTRAVENOUS | Status: DC | PRN
  Administered 2021-04-16 (×2): 500 mL

## 2021-04-16 MED ORDER — MIDAZOLAM HCL 2 MG/2ML IJ SOLN
INTRAMUSCULAR | Status: DC | PRN
  Administered 2021-04-16: 2 mg via INTRAVENOUS

## 2021-04-16 MED ORDER — INFLUENZA VAC SPLIT QUAD 0.5 ML IM SUSY
0.5000 mL | PREFILLED_SYRINGE | INTRAMUSCULAR | Status: AC
  Administered 2021-04-17: 0.5 mL via INTRAMUSCULAR
  Filled 2021-04-16: qty 0.5

## 2021-04-16 MED ORDER — SODIUM CHLORIDE 0.9 % IV SOLN: 250.0000 mL | INTRAVENOUS | Status: DC | PRN

## 2021-04-16 MED ORDER — CLOPIDOGREL BISULFATE 75 MG PO TABS
ORAL_TABLET | ORAL | Status: AC
  Filled 2021-04-16: qty 1

## 2021-04-16 MED ORDER — SODIUM CHLORIDE 0.9% FLUSH: 3.0000 mL | INTRAVENOUS | Status: DC | PRN

## 2021-04-16 MED ORDER — FENTANYL CITRATE (PF) 100 MCG/2ML IJ SOLN
INTRAMUSCULAR | Status: AC
  Filled 2021-04-16: qty 2

## 2021-04-16 MED ORDER — SODIUM CHLORIDE 0.9 % WEIGHT BASED INFUSION
1.0000 mL/kg/h | INTRAVENOUS | Status: AC
  Administered 2021-04-16: 1 mL/kg/h via INTRAVENOUS

## 2021-04-16 MED ORDER — IOHEXOL 350 MG/ML SOLN
INTRAVENOUS | Status: DC | PRN
  Administered 2021-04-16: 160 mL

## 2021-04-16 MED ORDER — PERFLUTREN LIPID MICROSPHERE
1.0000 mL | INTRAVENOUS | Status: DC | PRN
  Administered 2021-04-16: 5 mL via INTRAVENOUS
  Filled 2021-04-16: qty 10

## 2021-04-16 MED ORDER — NITROGLYCERIN 1 MG/10 ML FOR IR/CATH LAB
INTRA_ARTERIAL | Status: AC
  Filled 2021-04-16: qty 10

## 2021-04-16 MED ORDER — VERAPAMIL HCL 2.5 MG/ML IV SOLN
INTRAVENOUS | Status: AC
  Filled 2021-04-16: qty 2

## 2021-04-16 MED ORDER — NITROGLYCERIN 1 MG/10 ML FOR IR/CATH LAB
INTRA_ARTERIAL | Status: DC | PRN
  Administered 2021-04-16 (×2): 200 ug via INTRACORONARY

## 2021-04-16 MED ORDER — PNEUMOCOCCAL VAC POLYVALENT 25 MCG/0.5ML IJ INJ
0.5000 mL | INJECTION | INTRAMUSCULAR | Status: DC
  Filled 2021-04-16: qty 0.5

## 2021-04-16 MED ORDER — HEPARIN (PORCINE) IN NACL 1000-0.9 UT/500ML-% IV SOLN
INTRAVENOUS | Status: AC
  Filled 2021-04-16: qty 1000

## 2021-04-16 MED ORDER — SODIUM CHLORIDE 0.9% FLUSH
3.0000 mL | Freq: Two times a day (BID) | INTRAVENOUS | Status: DC
  Administered 2021-04-17 (×2): 3 mL via INTRAVENOUS

## 2021-04-16 MED ORDER — HYDRALAZINE HCL 20 MG/ML IJ SOLN
10.0000 mg | INTRAMUSCULAR | Status: AC | PRN
Start: 2021-04-16 — End: 2021-04-16

## 2021-04-16 MED ORDER — FENTANYL CITRATE (PF) 100 MCG/2ML IJ SOLN
INTRAMUSCULAR | Status: DC | PRN
  Administered 2021-04-16: 25 ug via INTRAVENOUS

## 2021-04-16 MED ORDER — POTASSIUM CHLORIDE CRYS ER 20 MEQ PO TBCR
40.0000 meq | EXTENDED_RELEASE_TABLET | Freq: Once | ORAL | Status: AC
  Administered 2021-04-16: 40 meq via ORAL
  Filled 2021-04-16: qty 4

## 2021-04-16 MED ORDER — CLOPIDOGREL BISULFATE 300 MG PO TABS
ORAL_TABLET | ORAL | Status: AC
  Filled 2021-04-16: qty 1

## 2021-04-16 MED ORDER — LIDOCAINE HCL (PF) 1 % IJ SOLN
INTRAMUSCULAR | Status: AC
  Filled 2021-04-16: qty 30

## 2021-04-16 MED ORDER — HEPARIN SODIUM (PORCINE) 1000 UNIT/ML IJ SOLN
INTRAMUSCULAR | Status: DC | PRN
  Administered 2021-04-16: 2000 [IU] via INTRAVENOUS
  Administered 2021-04-16: 3000 [IU] via INTRAVENOUS
  Administered 2021-04-16: 5000 [IU] via INTRAVENOUS
  Administered 2021-04-16: 2000 [IU] via INTRAVENOUS
  Administered 2021-04-16: 4000 [IU] via INTRAVENOUS

## 2021-04-16 MED ORDER — ENOXAPARIN SODIUM 40 MG/0.4ML IJ SOSY
40.0000 mg | PREFILLED_SYRINGE | INTRAMUSCULAR | Status: DC
  Administered 2021-04-16: 40 mg via SUBCUTANEOUS
  Filled 2021-04-16: qty 0.4

## 2021-04-16 MED ORDER — LIDOCAINE HCL (PF) 1 % IJ SOLN
INTRAMUSCULAR | Status: DC | PRN
  Administered 2021-04-16: 2 mL

## 2021-04-16 MED ORDER — SODIUM CHLORIDE 0.9% FLUSH
3.0000 mL | Freq: Two times a day (BID) | INTRAVENOUS | Status: DC
  Administered 2021-04-16 – 2021-04-17 (×3): 3 mL via INTRAVENOUS

## 2021-04-16 MED ORDER — METOPROLOL TARTRATE 12.5 MG HALF TABLET
12.5000 mg | ORAL_TABLET | Freq: Two times a day (BID) | ORAL | Status: DC
  Administered 2021-04-16 – 2021-04-17 (×3): 12.5 mg via ORAL
  Filled 2021-04-16 (×3): qty 1

## 2021-04-16 MED ORDER — SODIUM CHLORIDE 0.9 % WEIGHT BASED INFUSION
1.0000 mL/kg/h | INTRAVENOUS | Status: DC
  Administered 2021-04-16: 1 mL/kg/h via INTRAVENOUS

## 2021-04-16 MED ORDER — CLOPIDOGREL BISULFATE 75 MG PO TABS
75.0000 mg | ORAL_TABLET | Freq: Every day | ORAL | Status: DC
  Administered 2021-04-17: 75 mg via ORAL
  Filled 2021-04-16: qty 1

## 2021-04-16 MED ORDER — SODIUM CHLORIDE 0.9 % WEIGHT BASED INFUSION
3.0000 mL/kg/h | INTRAVENOUS | Status: DC
  Administered 2021-04-16: 3 mL/kg/h via INTRAVENOUS

## 2021-04-16 MED ORDER — MIDAZOLAM HCL 2 MG/2ML IJ SOLN
INTRAMUSCULAR | Status: AC
  Filled 2021-04-16: qty 2

## 2021-04-16 SURGICAL SUPPLY — 21 items
BALLN SAPPHIRE 2.25X10 (BALLOONS) ×2
BALLN SAPPHIRE ~~LOC~~ 2.5X10 (BALLOONS) ×2 IMPLANT
BALLOON SAPPHIRE 2.25X10 (BALLOONS) ×1 IMPLANT
CATH INFINITI 5 FR IM (CATHETERS) ×2 IMPLANT
CATH INFINITI 5 FR RCB (CATHETERS) ×2 IMPLANT
CATH INFINITI 5FR MULTPACK ANG (CATHETERS) ×2 IMPLANT
CATH LAUNCHER 6FR JR4 (CATHETERS) ×2 IMPLANT
CATH VISTA GUIDE 6FR XBLAD3.5 (CATHETERS) ×2 IMPLANT
DEVICE RAD COMP TR BAND LRG (VASCULAR PRODUCTS) ×2 IMPLANT
GLIDESHEATH SLEND SS 6F .021 (SHEATH) ×2 IMPLANT
GUIDEWIRE INQWIRE 1.5J.035X260 (WIRE) ×1 IMPLANT
GUIDEWIRE PRESSURE X 175 (WIRE) ×2 IMPLANT
INQWIRE 1.5J .035X260CM (WIRE) ×2
KIT ENCORE 26 ADVANTAGE (KITS) ×4 IMPLANT
KIT HEART LEFT (KITS) ×2 IMPLANT
PACK CARDIAC CATHETERIZATION (CUSTOM PROCEDURE TRAY) ×2 IMPLANT
STENT ONYX FRONTIER 2.5X12 (Permanent Stent) ×2 IMPLANT
SYR MEDRAD MARK 7 150ML (SYRINGE) ×2 IMPLANT
TRANSDUCER W/STOPCOCK (MISCELLANEOUS) ×2 IMPLANT
TUBING CIL FLEX 10 FLL-RA (TUBING) ×2 IMPLANT
WIRE ASAHI PROWATER 180CM (WIRE) ×2 IMPLANT

## 2021-04-16 NOTE — Progress Notes (Signed)
[facility] for heparin Indication: chest pain/ACS  Allergies  Allergen Reactions   Codeine Rash   Phenergan [Promethazine Hcl] Anxiety    Patient becomes restless & anxious    Patient Measurements: Height: 6' (182.9 cm) Weight: 92.7 kg (204 lb 6.4 oz) (scale b) IBW/kg (Calculated) : 77.6 Heparin Dosing Weight: 95.3 kg  Vital Signs: Temp: 97.6 F (36.4 C) (10/14 0406) Temp Source: Oral (10/14 0406) BP: 114/69 (10/14 0406) Pulse Rate: 57 (10/14 0406)  Labs: Recent Labs    04/15/21 1014 04/15/21 1214 04/15/21 1920 04/16/21 0416  HGB 16.0  --   --  14.7  HCT 46.4  --   --  41.8  PLT 198  --   --  172  HEPARINUNFRC  --   --  <0.10* 0.22*  CREATININE 0.66  --   --   --   TROPONINIHS 5 5  --   --      Estimated Creatinine Clearance: 107.8 mL/min (by C-G formula based on SCr of 0.66 mg/dL).   Assessment: 60 yo M with heparin consult for ACS/STEMI. Cardiology consulted. CBC stable. No AC PTA.  Heparin level subtherapeutic (0.22) on gtt at 1350 units/hr. No issues with line or bleeding reported per RN.  Goal of Therapy:  Heparin level 0.3-0.7 units/ml Monitor platelets by anticoagulation protocol: Yes   Plan:  Increase heparin to 1550 units/hr  Check 6 hr heparin level   Sherlon Handing, PharmD, BCPS Please see amion for complete clinical pharmacist phone list 04/16/2021 5:31 AM

## 2021-04-16 NOTE — Interval H&P Note (Signed)
History and Physical Interval Note:  04/16/2021 1:46 PM  Joel Berger  has presented today for surgery, with the diagnosis of abnormal stress test.  The various methods of treatment have been discussed with the patient and family. After consideration of risks, benefits and other options for treatment, the patient has consented to  Procedure(s): LEFT HEART CATH AND CORS/GRAFTS ANGIOGRAPHY (N/A) as a surgical intervention.  The patient's history has been reviewed, patient examined, no change in status, stable for surgery.  I have reviewed the patient's chart and labs.  Questions were answered to the patient's satisfaction.   Cath Lab Visit (complete for each Cath Lab visit)  Clinical Evaluation Leading to the Procedure:   ACS: No.  Non-ACS:    Anginal Classification: CCS III  Anti-ischemic medical therapy: Minimal Therapy (1 class of medications)  Non-Invasive Test Results: Low-risk stress test findings: cardiac mortality <1%/year  Prior CABG: Previous CABG        Col[facility] 04/16/2021 1:46 PM

## 2021-04-16 NOTE — Progress Notes (Addendum)
Progress Note  Patient Name: Joel Berger Date of Encounter: 04/16/2021  Primary Cardiologist: Fransico Him, MD  Subjective   Feeling well this AM. No CP overnight. BP normal. Received Tramadol yesterday afternoon for back pain, but felt fine overnight.  Inpatient Medications    Scheduled Meds:  aspirin EC  81 mg Oral Daily   ezetimibe  10 mg Oral Daily   insulin aspart  0-9 Units Subcutaneous TID WC   lisinopril  2.5 mg Oral Daily   metoprolol tartrate  12.5 mg Oral BID   Continuous Infusions:  heparin 1,550 Units/hr (04/16/21 0545)   PRN Meds: acetaminophen, nitroGLYCERIN, ondansetron (ZOFRAN) IV, traMADol   Vital Signs    Vitals:   04/15/21 1506 04/15/21 2031 04/16/21 0046 04/16/21 0406  BP: 127/68 134/80 130/70 114/69  Pulse: 65 (!) 58 (!) 51 (!) 57  Resp: 20 18 18 18   Temp: 98.4 F (36.9 C) 98.2 F (36.8 C) 98.1 F (36.7 C) 97.6 F (36.4 C)  TempSrc: Oral Oral Oral Oral  SpO2: 100% 96% 97% 96%  Weight:    92.7 kg  Height:        Intake/Output Summary (Last 24 hours) at 04/16/2021 0745 Last data filed at 04/16/2021 0546 Gross per 24 hour  Intake 787.26 ml  Output 2000 ml  Net -1212.74 ml   Last 3 Weights 04/16/2021 04/15/2021 04/15/2021  Weight (lbs) 204 lb 6.4 oz 204 lb 9.4 oz 210 lb  Weight (kg) 92.715 kg 92.8 kg 95.255 kg     Telemetry    NSR - Personally Reviewed  Physical Exam   GEN: No acute distress.  HEENT: Normocephalic, atraumatic, sclera non-icteric. Neck: No JVD or bruits. Cardiac: RRR no murmurs, rubs, or gallops.  Respiratory: Clear to auscultation bilaterally. Breathing is unlabored. GI: Soft, nontender, non-distended, BS +x 4. MS: no deformity. No skin rashes Extremities: No clubbing or cyanosis. No edema. Distal pedal pulses are 2+ and equal bilaterally. Neuro:  AAOx3. Follows commands. Psych:  Responds to questions appropriately with a normal affect.  Labs    High Sensitivity Troponin:   Recent Labs  Lab  04/15/21 1014 04/15/21 1214  TROPONINIHS 5 5      Cardiac EnzymesNo results for input(s): TROPONINI in the last 168 hours. No results for input(s): TROPIPOC in the last 168 hours.   Chemistry Recent Labs  Lab 04/15/21 1014 04/16/21 0416  NA 139 137  K 3.8 3.4*  CL 101 103  CO2 28 26  GLUCOSE 142* 158*  BUN 19 14  CREATININE 0.66 0.69  CALCIUM 9.4 8.9  PROT  --  6.1*  ALBUMIN  --  3.7  AST  --  17  ALT  --  19  ALKPHOS  --  61  BILITOT  --  0.9  GFRNONAA >60 >60  ANIONGAP 10 8     Hematology Recent Labs  Lab 04/15/21 1014 04/16/21 0416  WBC 4.7 4.5  RBC 5.45 4.94  HGB 16.0 14.7  HCT 46.4 41.8  MCV 85.1 84.6  MCH 29.4 29.8  MCHC 34.5 35.2  RDW 13.0 12.9  PLT 198 172    BNPNo results for input(s): BNP, PROBNP in the last 168 hours.   DDimer No results for input(s): DDIMER in the last 168 hours.   Radiology    DG Chest Port 1 View  Result Date: 04/15/2021 CLINICAL DATA:  Chest pain. EXAM: PORTABLE CHEST 1 VIEW COMPARISON:  February 27, 2015. FINDINGS: Stable cardiomediastinal silhouette. Status post coronary bypass  graft. Lungs are clear. Bony thorax is unremarkable. IMPRESSION: No active disease. Electronically Signed   By: Marijo Conception M.D.   On: 04/15/2021 11:02   CT RENAL STONE STUDY  Result Date: 04/15/2021 CLINICAL DATA:  Right-sided flank pain. EXAM: CT ABDOMEN AND PELVIS WITHOUT CONTRAST TECHNIQUE: Multidetector CT imaging of the abdomen and pelvis was performed following the standard protocol without IV contrast. COMPARISON:  None. FINDINGS: Lower chest: Unremarkable. Hepatobiliary: No focal abnormality in the liver on this study without intravenous contrast. Layering tiny calcified gallstones. No intrahepatic or extrahepatic biliary dilation. Pancreas: No focal mass lesion. No dilatation of the main duct. No intraparenchymal cyst. No peripancreatic edema. Spleen: No splenomegaly. No focal mass lesion. Adrenals/Urinary Tract: No adrenal nodule or  mass. Right kidney unremarkable. 3.2 cm lesion upper pole left kidney approaches water density, compatible with a cyst. No evidence for hydroureter. The urinary bladder appears normal for the degree of distention. Stomach/Bowel: Stomach is moderately distended with food. Duodenum is normally positioned as is the ligament of Treitz. No small bowel wall thickening. No small bowel dilatation. The terminal ileum is normal. The appendix is normal. No gross colonic mass. No colonic wall thickening. Vascular/Lymphatic: There is moderate atherosclerotic calcification of the abdominal aorta without aneurysm. There is no gastrohepatic or hepatoduodenal ligament lymphadenopathy. No retroperitoneal or mesenteric lymphadenopathy. No pelvic sidewall lymphadenopathy. Reproductive: The prostate gland and seminal vesicles are unremarkable. Other: No intraperitoneal free fluid. Musculoskeletal: No worrisome lytic or sclerotic osseous abnormality. IMPRESSION: 1. No acute findings in the abdomen or pelvis. Specifically, no findings to explain the patient's history of right flank pain. 2. Cholelithiasis. 3. Aortic Atherosclerosis (ICD10-I70.0). Electronically Signed   By: Misty Stanley M.D.   On: 04/15/2021 19:12    Cardiac Studies   Pending ETT  Patient Profile     Pt is a 60 y.o. 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 He presented with 3 weeks of right lower back accompanied by elevated BP, followed by 1 week of intermittent chest pain with mixed features.  Assessment & Plan    1. Chest pain with mixed features - no objective evidence of ischemia thus far - EKG shows NSR without acute changes and troponins negative - plan ETT today - hold metoprolol this  AM - stop IV heparin and change to DVT prophylaxis only  2. CAD s/p CABG 2016 - continue ASA, BB, Repatha (statin intolerant) - lipids show LDL of 2 - ? Stop Zetia at this point - LFTs OK   3. Right lower back pain - no focal physical exam findings - CT renal stone protocol showed no acute findings, + cholelithiasis and aortic atherosclerosis - UA with glycosuria >500 (on Farxiga), small Hgb (not correlated on microscopy), 80 ketones   4. HTN with recent elevation in BP - ?incited by recent naproxen use +/- back pain - would continue to hold NSAID - thyroid normal - BP has been normal on home regimen here    5. Ischemic cardiomyopathy - no evidence of volume overload on exam - 2d echocardiogram pending - anticipate continuing low dose lisinopril, metoprolol, titrate meds as needed   6. Remote post-op afib at time of CABG - since isolated to CABG, not on [facility] - monitor in 2016 showed predominantly NSR, PACs,  PVCs - maintaining NSR   7. Diabetes mellitus - per patient, A1C recently elevated prompting initiation of Farxiga - A1C 8.7 here - hold metformin, glipizide until procedures complete  - hold Actos - less ideal choice given LV dysfunction - will review [place] plans with MD - add SSI - continue to follow with ophthalmology/PCP   8. Hypokalemia - replete with 84meq KCl this AM  For questions or updates, please contact [place] Please consult [email or web address] for contact info under Cardiology/STEMI.  Signed, Charlie Pitter, PA-C 04/16/2021, 7:45 AM     History and all data above reviewed.  Patient examined.  I agree with the findings as above.  I reviewed the POET (Plain Old Exercise Treadmill) .  He had deep lateral T wave inversion.  No pain. The patient exam reveals COR:RRR  ,  Lungs: Clear  ,  Abd: Positive bowel sounds, no rebound no guarding, Ext No edema  .  All available labs, radiology testing, previous records reviewed. Agree with documented assessment and  plan.   Chest pain:  Abnormal POET (Plain Old Exercise Treadmill) that must be interpreted as positive for ischemia and this was not late in exercise.  Higher risk.  With this and resting pain cardiac cath is indicated.  The patient understands that risks included but are not limited to stroke (1 in 1000), death (1 in 26), kidney failure [usually temporary] (1 in 500), bleeding (1 in 200), allergic reaction [possibly serious] (1 in 200).  The patient understands and agrees to proceed.    Jeneen Rinks Virgilio Broadhead  12:16 PM  04/16/2021

## 2021-04-16 NOTE — H&P (View-Only) (Signed)
Progress Note  Patient Name: Joel Berger Date of Encounter: 04/16/2021  Primary Cardiologist: Fransico Him, MD  Subjective   Feeling well this AM. No CP overnight. BP normal. Received Tramadol yesterday afternoon for back pain, but felt fine overnight.  Inpatient Medications    Scheduled Meds:  aspirin EC  81 mg Oral Daily   ezetimibe  10 mg Oral Daily   insulin aspart  0-9 Units Subcutaneous TID WC   lisinopril  2.5 mg Oral Daily   metoprolol tartrate  12.5 mg Oral BID   Continuous Infusions:  heparin 1,550 Units/hr (04/16/21 0545)   PRN Meds: acetaminophen, nitroGLYCERIN, ondansetron (ZOFRAN) IV, traMADol   Vital Signs    Vitals:   04/15/21 1506 04/15/21 2031 04/16/21 0046 04/16/21 0406  BP: 127/68 134/80 130/70 114/69  Pulse: 65 (!) 58 (!) 51 (!) 57  Resp: 20 18 18 18   Temp: 98.4 F (36.9 C) 98.2 F (36.8 C) 98.1 F (36.7 C) 97.6 F (36.4 C)  TempSrc: Oral Oral Oral Oral  SpO2: 100% 96% 97% 96%  Weight:    92.7 kg  Height:        Intake/Output Summary (Last 24 hours) at 04/16/2021 0745 Last data filed at 04/16/2021 0546 Gross per 24 hour  Intake 787.26 ml  Output 2000 ml  Net -1212.74 ml   Last 3 Weights 04/16/2021 04/15/2021 04/15/2021  Weight (lbs) 204 lb 6.4 oz 204 lb 9.4 oz 210 lb  Weight (kg) 92.715 kg 92.8 kg 95.255 kg     Telemetry    NSR - Personally Reviewed  Physical Exam   GEN: No acute distress.  HEENT: Normocephalic, atraumatic, sclera non-icteric. Neck: No JVD or bruits. Cardiac: RRR no murmurs, rubs, or gallops.  Respiratory: Clear to auscultation bilaterally. Breathing is unlabored. GI: Soft, nontender, non-distended, BS +x 4. MS: no deformity. No skin rashes Extremities: No clubbing or cyanosis. No edema. Distal pedal pulses are 2+ and equal bilaterally. Neuro:  AAOx3. Follows commands. Psych:  Responds to questions appropriately with a normal affect.  Labs    High Sensitivity Troponin:   Recent Labs  Lab  04/15/21 1014 04/15/21 1214  TROPONINIHS 5 5      Cardiac EnzymesNo results for input(s): TROPONINI in the last 168 hours. No results for input(s): TROPIPOC in the last 168 hours.   Chemistry Recent Labs  Lab 04/15/21 1014 04/16/21 0416  NA 139 137  K 3.8 3.4*  CL 101 103  CO2 28 26  GLUCOSE 142* 158*  BUN 19 14  CREATININE 0.66 0.69  CALCIUM 9.4 8.9  PROT  --  6.1*  ALBUMIN  --  3.7  AST  --  17  ALT  --  19  ALKPHOS  --  61  BILITOT  --  0.9  GFRNONAA >60 >60  ANIONGAP 10 8     Hematology Recent Labs  Lab 04/15/21 1014 04/16/21 0416  WBC 4.7 4.5  RBC 5.45 4.94  HGB 16.0 14.7  HCT 46.4 41.8  MCV 85.1 84.6  MCH 29.4 29.8  MCHC 34.5 35.2  RDW 13.0 12.9  PLT 198 172    BNPNo results for input(s): BNP, PROBNP in the last 168 hours.   DDimer No results for input(s): DDIMER in the last 168 hours.   Radiology    DG Chest Port 1 View  Result Date: 04/15/2021 CLINICAL DATA:  Chest pain. EXAM: PORTABLE CHEST 1 VIEW COMPARISON:  February 27, 2015. FINDINGS: Stable cardiomediastinal silhouette. Status post coronary bypass  graft. Lungs are clear. Bony thorax is unremarkable. IMPRESSION: No active disease. Electronically Signed   By: Marijo Conception M.D.   On: 04/15/2021 11:02   CT RENAL STONE STUDY  Result Date: 04/15/2021 CLINICAL DATA:  Right-sided flank pain. EXAM: CT ABDOMEN AND PELVIS WITHOUT CONTRAST TECHNIQUE: Multidetector CT imaging of the abdomen and pelvis was performed following the standard protocol without IV contrast. COMPARISON:  None. FINDINGS: Lower chest: Unremarkable. Hepatobiliary: No focal abnormality in the liver on this study without intravenous contrast. Layering tiny calcified gallstones. No intrahepatic or extrahepatic biliary dilation. Pancreas: No focal mass lesion. No dilatation of the main duct. No intraparenchymal cyst. No peripancreatic edema. Spleen: No splenomegaly. No focal mass lesion. Adrenals/Urinary Tract: No adrenal nodule or  mass. Right kidney unremarkable. 3.2 cm lesion upper pole left kidney approaches water density, compatible with a cyst. No evidence for hydroureter. The urinary bladder appears normal for the degree of distention. Stomach/Bowel: Stomach is moderately distended with food. Duodenum is normally positioned as is the ligament of Treitz. No small bowel wall thickening. No small bowel dilatation. The terminal ileum is normal. The appendix is normal. No gross colonic mass. No colonic wall thickening. Vascular/Lymphatic: There is moderate atherosclerotic calcification of the abdominal aorta without aneurysm. There is no gastrohepatic or hepatoduodenal ligament lymphadenopathy. No retroperitoneal or mesenteric lymphadenopathy. No pelvic sidewall lymphadenopathy. Reproductive: The prostate gland and seminal vesicles are unremarkable. Other: No intraperitoneal free fluid. Musculoskeletal: No worrisome lytic or sclerotic osseous abnormality. IMPRESSION: 1. No acute findings in the abdomen or pelvis. Specifically, no findings to explain the patient's history of right flank pain. 2. Cholelithiasis. 3. Aortic Atherosclerosis (ICD10-I70.0). Electronically Signed   By: Misty Stanley M.D.   On: 04/15/2021 19:12    Cardiac Studies   Pending ETT  Patient Profile     Pt is a 60 y.o. 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 He presented with 3 weeks of right lower back accompanied by elevated BP, followed by 1 week of intermittent chest pain with mixed features.  Assessment & Plan    1. Chest pain with mixed features - no objective evidence of ischemia thus far - EKG shows NSR without acute changes and troponins negative - plan ETT today - hold metoprolol this  AM - stop IV heparin and change to DVT prophylaxis only  2. CAD s/p CABG 2016 - continue ASA, BB, Repatha (statin intolerant) - lipids show LDL of 2 - ? Stop Zetia at this point - LFTs OK   3. Right lower back pain - no focal physical exam findings - CT renal stone protocol showed no acute findings, + cholelithiasis and aortic atherosclerosis - UA with glycosuria >500 (on Farxiga), small Hgb (not correlated on microscopy), 80 ketones   4. HTN with recent elevation in BP - ?incited by recent naproxen use +/- back pain - would continue to hold NSAID - thyroid normal - BP has been normal on home regimen here    5. Ischemic cardiomyopathy - no evidence of volume overload on exam - 2d echocardiogram pending - anticipate continuing low dose lisinopril, metoprolol, titrate meds as needed   6. Remote post-op afib at time of CABG - since isolated to CABG, not on [facility] - monitor in 2016 showed predominantly NSR, PACs,  PVCs - maintaining NSR   7. Diabetes mellitus - per patient, A1C recently elevated prompting initiation of Farxiga - A1C 8.7 here - hold metformin, glipizide until procedures complete  - hold Actos - less ideal choice given LV dysfunction - will review [place] plans with MD - add SSI - continue to follow with ophthalmology/PCP   8. Hypokalemia - replete with 69meq KCl this AM  For questions or updates, please contact [place] Please consult [email or web address] for contact info under Cardiology/STEMI.  Signed, Charlie Pitter, PA-C 04/16/2021, 7:45 AM     History and all data above reviewed.  Patient examined.  I agree with the findings as above.  I reviewed the POET (Plain Old Exercise Treadmill) .  He had deep lateral T wave inversion.  No pain. The patient exam reveals COR:RRR  ,  Lungs: Clear  ,  Abd: Positive bowel sounds, no rebound no guarding, Ext No edema  .  All available labs, radiology testing, previous records reviewed. Agree with documented assessment and  plan.   Chest pain:  Abnormal POET (Plain Old Exercise Treadmill) that must be interpreted as positive for ischemia and this was not late in exercise.  Higher risk.  With this and resting pain cardiac cath is indicated.  The patient understands that risks included but are not limited to stroke (1 in 1000), death (1 in 77), kidney failure [usually temporary] (1 in 500), bleeding (1 in 200), allergic reaction [possibly serious] (1 in 200).  The patient understands and agrees to proceed.    Jeneen Rinks Konstantin Lehnen  12:16 PM  04/16/2021

## 2021-04-17 ENCOUNTER — Other Ambulatory Visit: Payer: Self-pay | Admitting: Cardiology

## 2021-04-17 LAB — BASIC METABOLIC PANEL
Anion gap: 8 (ref 5–15)
BUN: 13 mg/dL (ref 6–20)
CO2: 23 mmol/L (ref 22–32)
Calcium: 8.8 mg/dL — ABNORMAL LOW (ref 8.9–10.3)
Chloride: 107 mmol/L (ref 98–111)
Creatinine, Ser: 0.77 mg/dL (ref 0.61–1.24)
GFR, Estimated: >60 mL/min (ref >60)
Glucose, Bld: 156 mg/dL — ABNORMAL HIGH (ref 70–99)
Potassium: 3.7 mmol/L (ref 3.5–5.1)
Sodium: 138 mmol/L (ref 135–145)

## 2021-04-17 LAB — CBC
HCT: 41.1 % (ref 39.0–52.0)
Hemoglobin: 14.1 g/dL (ref 13.0–17.0)
MCH: 29.3 pg (ref 26.0–34.0)
MCHC: 34.3 g/dL (ref 30.0–36.0)
MCV: 85.4 fL (ref 80.0–100.0)
Platelets: 161 10*3/uL (ref 150–400)
RBC: 4.81 MIL/uL (ref 4.22–5.81)
RDW: 13.1 % (ref 11.5–15.5)
WBC: 5.2 10*3/uL (ref 4.0–10.5)
nRBC: 0 % (ref 0.0–0.2)

## 2021-04-17 LAB — GLUCOSE, CAPILLARY
Glucose-Capillary: 199 mg/dL — ABNORMAL HIGH (ref 70–99)
Glucose-Capillary: 163 mg/dL — ABNORMAL HIGH (ref 70–99)

## 2021-04-17 MED ORDER — NITROGLYCERIN 0.4 MG SL SUBL: 0.4000 mg | SUBLINGUAL_TABLET | SUBLINGUAL | 2 refills | Status: AC | PRN

## 2021-04-17 MED ORDER — METFORMIN HCL 1000 MG PO TABS: ORAL_TABLET | ORAL | Status: AC

## 2021-04-17 MED ORDER — CLOPIDOGREL BISULFATE 75 MG PO TABS: 75.0000 mg | ORAL_TABLET | Freq: Every day | ORAL | 3 refills | Status: DC

## 2021-04-17 NOTE — Progress Notes (Signed)
CARDIAC REHAB PHASE I   PRE:  Rate/Rhythm: 65 SR    BP: sitting 130/73    SaO2:   MODE:  Ambulation: 670 ft   POST:  Rate/Rhythm: 82 SR    BP: sitting 148/75     SaO2:   Tolerated well, no c/o. Discussed stent, Plavix, restrictions, diet, exercise, NTG and CRPII. Pt receptive. Will refer to [facility] however pt not interested as he did it before.  [place], ACSM 04/17/2021 8:12 AM

## 2021-04-17 NOTE — Progress Notes (Signed)
Progress Note  Patient Name: Joel Berger Date of Encounter: 04/17/2021  Primary Cardiologist: Fransico Him, MD  Subjective   Feeling well this AM. No CP overnight. BP normal. Received Tramadol yesterday afternoon for back pain, but felt fine overnight.  Inpatient Medications    Scheduled Meds:  aspirin EC  81 mg Oral Daily   clopidogrel  75 mg Oral Q breakfast   enoxaparin (LOVENOX) injection  40 mg Subcutaneous Q24H   ezetimibe  10 mg Oral Daily   influenza vac split quadrivalent PF  0.5 mL Intramuscular Tomorrow-1000   insulin aspart  0-9 Units Subcutaneous TID WC   lisinopril  2.5 mg Oral Daily   metoprolol tartrate  12.5 mg Oral BID   pneumococcal 23 valent vaccine  0.5 mL Intramuscular Tomorrow-1000   sodium chloride flush  3 mL Intravenous Q12H   sodium chloride flush  3 mL Intravenous Q12H   Continuous Infusions:  sodium chloride     PRN Meds: sodium chloride, acetaminophen, nitroGLYCERIN, ondansetron (ZOFRAN) IV, sodium chloride flush, traMADol   Vital Signs    Vitals:   04/16/21 2009 04/16/21 2251 04/17/21 0037 04/17/21 0513  BP: 129/72 115/63 (!) 119/58 122/75  Pulse: 67 65 (!) 57 (!) 57  Resp: 15  16 19   Temp: 97.8 F (36.6 C)  98 F (36.7 C) 98.3 F (36.8 C)  TempSrc: Oral  Oral Oral  SpO2: 100%  95% 100%  Weight:      Height:        Intake/Output Summary (Last 24 hours) at 04/17/2021 0839 Last data filed at 04/17/2021 0514 Gross per 24 hour  Intake 1332.32 ml  Output 1875 ml  Net -542.68 ml   Last 3 Weights 04/16/2021 04/15/2021 04/15/2021  Weight (lbs) 204 lb 6.4 oz 204 lb 9.4 oz 210 lb  Weight (kg) 92.715 kg 92.8 kg 95.255 kg     Telemetry    NSR - Personally Reviewed  Physical Exam   GEN: No acute distress.  HEENT: Normocephalic, atraumatic, sclera non-icteric. Neck: No JVD or bruits. Cardiac: RRR no murmurs, rubs, or gallops.  Respiratory: Clear to auscultation bilaterally. Breathing is unlabored. GI: Soft, nontender,  non-distended, BS +x 4. MS: no deformity. No skin rashes Extremities: No clubbing or cyanosis. No edema. Distal pedal pulses are 2+ and equal bilaterally. Neuro:  AAOx3. Follows commands. Psych:  Responds to questions appropriately with a normal affect.  Labs    High Sensitivity Troponin:   Recent Labs  Lab 04/15/21 1014 04/15/21 1214  TROPONINIHS 5 5      Cardiac EnzymesNo results for input(s): TROPONINI in the last 168 hours. No results for input(s): TROPIPOC in the last 168 hours.   Chemistry Recent Labs  Lab 04/15/21 1014 04/16/21 0416 04/17/21 0300  NA 139 137 138  K 3.8 3.4* 3.7  CL 101 103 107  CO2 28 26 23   GLUCOSE 142* 158* 156*  BUN 19 14 13   CREATININE 0.66 0.69 0.77  CALCIUM 9.4 8.9 8.8*  PROT  --  6.1*  --   ALBUMIN  --  3.7  --   AST  --  17  --   ALT  --  19  --   ALKPHOS  --  61  --   BILITOT  --  0.9  --   GFRNONAA >60 >60 >60  ANIONGAP 10 8 8      Hematology Recent Labs  Lab 04/15/21 1014 04/16/21 0416 04/17/21 0300  WBC 4.7 4.5 5.2  RBC  5.45 4.94 4.81  HGB 16.0 14.7 14.1  HCT 46.4 41.8 41.1  MCV 85.1 84.6 85.4  MCH 29.4 29.8 29.3  MCHC 34.5 35.2 34.3  RDW 13.0 12.9 13.1  PLT 198 172 161    BNPNo results for input(s): BNP, PROBNP in the last 168 hours.   DDimer No results for input(s): DDIMER in the last 168 hours.   Radiology    CARDIAC CATHETERIZATION  Result Date: 04/16/2021   Prox LAD to Mid LAD lesion is 99% stenosed.   1st Diag-1 lesion is 50% stenosed.   1st Diag-2 lesion is 75% stenosed.   A drug-eluting stent was successfully placed using a STENT ONYX FRONTIER 2.5X12.   Post intervention, there is a 0% residual stenosis.   1st Mrg lesion is 40% stenosed.   Prox RCA to Mid RCA lesion is 60% stenosed.   Dist RCA lesion is 50% stenosed.   RPAV lesion is 100% stenosed.   Origin lesion is 100% stenosed.   Origin lesion is 100% stenosed.   LIMA graft was visualized by angiography and is normal in caliber.   The graft exhibits no  disease.   The left ventricular systolic function is normal.   LV end diastolic pressure is normal.   The left ventricular ejection fraction is 55-65% by visual estimate. 2 vessel obstructive CAD- 99% mid LAD, 70% mid first diagonal, PL 100%- chronic with collaterals Occluded SVG to the Diagonal and SVG to the PDA Normal RFR of the mid RCA Normal LV function Successful PCI of the mid diagonal with DES x 1 guided by RFR Plan: DAPT for 6 months with ASA and Plavix. Anticipate DC in am.   DG Chest Port 1 View  Result Date: 04/15/2021 CLINICAL DATA:  Chest pain. EXAM: PORTABLE CHEST 1 VIEW COMPARISON:  February 27, 2015. FINDINGS: Stable cardiomediastinal silhouette. Status post coronary bypass graft. Lungs are clear. Bony thorax is unremarkable. IMPRESSION: No active disease. Electronically Signed   By: Marijo Conception M.D.   On: 04/15/2021 11:02   ECHOCARDIOGRAM COMPLETE  Result Date: 04/16/2021    ECHOCARDIOGRAM REPORT   Patient Name:   Joel Berger Fizer Date of Exam: 04/16/2021 Medical Rec #:  [id number]     Height:       72.0 in Accession #:    [id number]    Weight:       204.4 lb Date of Birth:  03-Nov-1960     BSA:          2.150 m Patient Age:    60 years      BP:           114/69 mmHg Patient Gender: M             HR:           57 bpm. Exam Location:  Inpatient Procedure: 2D Echo, Cardiac Doppler and Color Doppler Indications:     Chest Pain  History:         Patient has prior history of Echocardiogram examinations, most                  recent 05/18/2015. Risk Factors:Family History of Coronary                  Artery Disease, Hypertension, Diabetes and Dyslipidemia.  Sonographer:     Helmut Muster Referring Phys:  [place] Diagnosing Phys: Cherlynn Kaiser MD IMPRESSIONS  1. Left ventricular ejection fraction, by estimation, is 50 to  55%. The left ventricle has low normal function. The left ventricle demonstrates regional wall motion abnormalities (see scoring diagram/findings for description).  There is mild left ventricular hypertrophy. Left ventricular diastolic parameters are consistent with Grade II diastolic dysfunction (pseudonormalization). There is Abnormal septal motion due to conduction delay.  2. Right ventricular systolic function is normal. The right ventricular size is normal. Tricuspid regurgitation signal is inadequate for assessing PA pressure.  3. Left atrial size was mildly dilated.  4. The mitral valve is normal in structure. Trivial mitral valve regurgitation. No evidence of mitral stenosis.  5. The aortic valve is tricuspid. Aortic valve regurgitation is trivial. No aortic stenosis is present.  6. The inferior vena cava is normal in size with greater than 50% respiratory variability, suggesting right atrial pressure of 3 mmHg. FINDINGS  Left Ventricle: Left ventricular ejection fraction, by estimation, is 50 to 55%. The left ventricle has low normal function. The left ventricle demonstrates regional wall motion abnormalities. Definity contrast agent was given IV to delineate the left ventricular endocardial borders. The left ventricular internal cavity size was normal in size. There is mild left ventricular hypertrophy. Abnormal septal motion due to conduction delay. Left ventricular diastolic parameters are consistent with Grade II diastolic dysfunction (pseudonormalization).  LV Wall Scoring: The inferior wall and basal inferolateral segment are hypokinetic. Right Ventricle: The right ventricular size is normal. No increase in right ventricular wall thickness. Right ventricular systolic function is normal. Tricuspid regurgitation signal is inadequate for assessing PA pressure. The tricuspid regurgitant velocity is 1.02 m/s, and with an assumed right atrial pressure of 3 mmHg, the estimated right ventricular systolic pressure is 7.2 mmHg. Left Atrium: Left atrial size was mildly dilated. Right Atrium: Right atrial size was normal in size. Pericardium: There is no evidence of  pericardial effusion. Mitral Valve: The mitral valve is normal in structure. Trivial mitral valve regurgitation. No evidence of mitral valve stenosis. Tricuspid Valve: The tricuspid valve is normal in structure. Tricuspid valve regurgitation is trivial. No evidence of tricuspid stenosis. Aortic Valve: The aortic valve is tricuspid. Aortic valve regurgitation is trivial. No aortic stenosis is present. Pulmonic Valve: The pulmonic valve was normal in structure. Pulmonic valve regurgitation is trivial. No evidence of pulmonic stenosis. Aorta: The aortic root is normal in size and structure. Venous: The inferior vena cava is normal in size with greater than 50% respiratory variability, suggesting right atrial pressure of 3 mmHg. IAS/Shunts: No atrial level shunt detected by color flow Doppler.  LEFT VENTRICLE PLAX 2D LVIDd:         5.10 cm      Diastology LVIDs:         3.87 cm      LV e' medial:    5.95 cm/s LV PW:         1.10 cm      LV E/e' medial:  15.8 LV IVS:        1.10 cm      LV e' lateral:   13.40 cm/s LVOT diam:     2.20 cm      LV E/e' lateral: 7.0 LV SV:         62 LV SV Index:   29 LVOT Area:     3.80 cm  LV Volumes (MOD) LV vol d, MOD A2C: 147.5 ml LV vol d, MOD A4C: 124.5 ml LV vol s, MOD A2C: 75.6 ml LV vol s, MOD A4C: 68.5 ml LV SV MOD A2C:     71.9  ml LV SV MOD A4C:     124.5 ml LV SV MOD BP:      62.2 ml RIGHT VENTRICLE             IVC RV S prime:     14.40 cm/s  IVC diam: 2.20 cm TAPSE (M-mode): 2.1 cm LEFT ATRIUM             Index        RIGHT ATRIUM           Index LA diam:        3.80 cm 1.77 cm/m   RA Area:     16.35 cm LA Vol (A2C):   87.4 ml 40.64 ml/m  RA Volume:   41.70 ml  19.39 ml/m LA Vol (A4C):   55.2 ml 25.67 ml/m LA Biplane Vol: 73.2 ml 34.04 ml/m  AORTIC VALVE LVOT Vmax:   73.30 cm/s LVOT Vmean:  47.500 cm/s LVOT VTI:    0.163 m  AORTA Ao Root diam: 3.40 cm Ao Asc diam:  2.80 cm MITRAL VALVE               TRICUSPID VALVE MV Area (PHT): 2.16 cm    TR Peak grad:   4.2 mmHg MV  Decel Time: 352 msec    TR Vmax:        102.00 cm/s MV E velocity: 94.30 cm/s MV A velocity: 69.20 cm/s  SHUNTS MV E/A ratio:  1.36        Systemic VTI:  0.16 m                            Systemic Diam: 2.20 cm Cherlynn Kaiser MD Electronically signed by Cherlynn Kaiser MD Signature Date/Time: 04/16/2021/1:17:50 PM    Final    Exercise Tolerance Test  Result Date: 04/16/2021   No ST deviation was noted.   T wave inversion noted in II, III, avF, V4-V6 which occurred early in exercise. Reviewed with Dr. Percival Spanish who reviewed study and feels that with T wave inversion, ETT is abnormal. Plan cardiac cath.   CT RENAL STONE STUDY  Result Date: 04/15/2021 CLINICAL DATA:  Right-sided flank pain. EXAM: CT ABDOMEN AND PELVIS WITHOUT CONTRAST TECHNIQUE: Multidetector CT imaging of the abdomen and pelvis was performed following the standard protocol without IV contrast. COMPARISON:  None. FINDINGS: Lower chest: Unremarkable. Hepatobiliary: No focal abnormality in the liver on this study without intravenous contrast. Layering tiny calcified gallstones. No intrahepatic or extrahepatic biliary dilation. Pancreas: No focal mass lesion. No dilatation of the main duct. No intraparenchymal cyst. No peripancreatic edema. Spleen: No splenomegaly. No focal mass lesion. Adrenals/Urinary Tract: No adrenal nodule or mass. Right kidney unremarkable. 3.2 cm lesion upper pole left kidney approaches water density, compatible with a cyst. No evidence for hydroureter. The urinary bladder appears normal for the degree of distention. Stomach/Bowel: Stomach is moderately distended with food. Duodenum is normally positioned as is the ligament of Treitz. No small bowel wall thickening. No small bowel dilatation. The terminal ileum is normal. The appendix is normal. No gross colonic mass. No colonic wall thickening. Vascular/Lymphatic: There is moderate atherosclerotic calcification of the abdominal aorta without aneurysm. There is no  gastrohepatic or hepatoduodenal ligament lymphadenopathy. No retroperitoneal or mesenteric lymphadenopathy. No pelvic sidewall lymphadenopathy. Reproductive: The prostate gland and seminal vesicles are unremarkable. Other: No intraperitoneal free fluid. Musculoskeletal: No worrisome lytic or sclerotic osseous abnormality. IMPRESSION: 1. No acute findings in the  abdomen or pelvis. Specifically, no findings to explain the patient's history of right flank pain. 2. Cholelithiasis. 3. Aortic Atherosclerosis (ICD10-I70.0). Electronically Signed   By: Misty Stanley M.D.   On: 04/15/2021 19:12    Cardiac Studies   Pending ETT  Patient Profile     Pt is a 60 y.o. 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 He presented with 3 weeks of right lower back accompanied by elevated BP, followed by 1 week of intermittent chest pain with mixed features.  Assessment & Plan    1. Chest pain with mixed features - cath yesterday with DES to mid D1 Occluded SVG;s with patent lima  - Continue DAT for a year    3. Right lower back pain - no focal physical exam findings - CT renal stone protocol showed no acute findings, + cholelithiasis and aortic atherosclerosis - UA with glycosuria >500 (on Farxiga), small Hgb (not correlated on microscopy), 80 ketones   4. HTN with recent elevation in BP - ?incited by recent naproxen use +/- back pain - would continue to hold NSAID - thyroid normal - BP has been normal on home regimen here    5. Ischemic cardiomyopathy - no evidence of volume overload on exam - EF 50-55% on ACE    6. Remote post-op afib at time of CABG - since isolated to CABG, not on Lillie - monitor in 2016 showed predominantly NSR, PACs, PVCs -  maintaining NSR   7. Diabetes mellitus - d/c actos continue Farxiga, metformin and glipizide    8. Hypokalemia - 3.7 this am   D/c home outpatient f/u [facility]  For questions or updates, please contact [place] Please consult [email or web address] for contact info under Cardiology/STEMI.  Signed, Jenkins Rouge, MD 04/17/2021, 8:39 AM

## 2021-04-17 NOTE — Progress Notes (Signed)
PIV removed.  Discharge instructions given verbally and written, all questions answered at this time. Influenza vaccine administered. Wife to transport patient home via private vehicle.  Cell phone, wallet and all belongings with patient.

## 2021-04-17 NOTE — Discharge Summary (Signed)
Discharge Summary    Patient ID: Joel Berger MRN: [id number]; DOB: 1961/05/05  Admit date: 04/15/2021 Discharge date: 04/17/2021  PCP:  Joel Berger., MD   [facility] Providers Cardiologist:  Joel Him, MD   Discharge Diagnoses    Principal Problem:   Chest pain Active Problems:   Essential hypertension   Diabetes mellitus type 2 in nonobese (HCC)   S/P CABG x 3   Coronary artery disease involving native coronary artery of native heart without angina pectoris   Postoperative atrial fibrillation (HCC)   Dyslipidemia, goal LDL below 70   Carotid artery stenosis   Unstable angina ([place])   Diagnostic Studies/Procedures    Echo 04/16/21:  1. Left ventricular ejection fraction, by estimation, is 50 to 55%. The  left ventricle has low normal function. The left ventricle demonstrates  regional wall motion abnormalities (see scoring diagram/findings for  description). There is mild left  ventricular hypertrophy. Left ventricular diastolic parameters are  consistent with Grade II diastolic dysfunction (pseudonormalization).  There is Abnormal septal motion due to conduction delay.   2. Right ventricular systolic function is normal. The right ventricular  size is normal. Tricuspid regurgitation signal is inadequate for assessing  PA pressure.   3. Left atrial size was mildly dilated.   4. The mitral valve is normal in structure. Trivial mitral valve  regurgitation. No evidence of mitral stenosis.   5. The aortic valve is tricuspid. Aortic valve regurgitation is trivial.  No aortic stenosis is present.   6. The inferior vena cava is normal in size with greater than 50%  respiratory variability, suggesting right atrial pressure of 3 mmHg.  _____________   ETT 04/16/21:   No ST deviation was noted.   T wave inversion noted in II, III, avF, V4-V6 which occurred early in exercise.   Reviewed with Dr. Percival Berger who reviewed study and feels that with T wave inversion,  ETT is abnormal. Plan cardiac cath.  _____________  Left heart cath 04/16/21:    Prox LAD to Mid LAD lesion is 99% stenosed.   1st Diag-1 lesion is 50% stenosed.   1st Diag-2 lesion is 75% stenosed.   A drug-eluting stent was successfully placed using a STENT ONYX FRONTIER 2.5X12.   Post intervention, there is a 0% residual stenosis.   1st Mrg lesion is 40% stenosed.   Prox RCA to Mid RCA lesion is 60% stenosed.   Dist RCA lesion is 50% stenosed.   RPAV lesion is 100% stenosed.   Origin lesion is 100% stenosed.   Origin lesion is 100% stenosed.   LIMA graft was visualized by angiography and is normal in caliber.   The graft exhibits no disease.   The left ventricular systolic function is normal.   LV end diastolic pressure is normal.   The left ventricular ejection fraction is 55-65% by visual estimate.   2 vessel obstructive CAD- 99% mid LAD, 70% mid first diagonal, PL 100%- chronic with collaterals Occluded SVG to the Diagonal and SVG to the PDA Normal RFR of the mid RCA Normal LV function Successful PCI of the mid diagonal with DES x 1 guided by RFR   Plan: DAPT for 6 months with ASA and Plavix. Anticipate DC in am.     History of Present Illness     Joel Berger is a 60 y.o. male with CAD (late presenting inferior MI 02/2015 with 3V CAD s/p CABG with LIMA-LAD, SVG-D1, SVG-PDA, ischemic cardiomyopathy 45-50%, post-op atrial fib isolated to  CABG admission, mild carotid artery disease (1-39% RICA by last duplex 01/2019), DM, dyslipidemia with statin intolerance on Zetia/Repatha, HTN who is being seen 04/15/2021 for the evaluation of chest discomfort, elevated BP, and back pain.  Joel Berger has the above medical history and has done well since CABG. He wore a monitor 05/2015 showing SB, NSR, sinus tach range 53-123bpm, occasional PACs/PVCs (very few per report). Last echo 05/2015 showed EF 45-50% with severe hypokinesis of the basal-midinferior and inferoseptal myocardium, restrictive  physiology indicative of decreased left ventricular diastolic compliance and/or increased left atrial pressure, mild-moderate MR, mild LAE, mild RVE, PASP 49mmHg. He has not required repeat ischemic testing since bypass.   He is accompanied by his wife on admission. About 3 weeks ago he began to develop right sided low back pain which he describes as "kidney pain." He initially attributed this to possible kidney stones as he'd had before but it has not resolved and he did not pass anything. He saw primary care who added Farxiga and naproxen. He does not recall having any specific workup otherwise. Since that time, more often than not, the pain has begun waking up Berger in the middle of the night as well. He has been taking naproxen 2 tablets BID but still with breakthrough pain. During this timeframe he also began seeing a retinal specialist for some blurry vision and he received a steroid injection in his eye last week. Periodically, he would have facial flushing or dizziness and when they'd check his BP it would be much higher than normal, 150s-160s/80s-100s. Over the last week he has begun noticing episodic left sided dull chest discomfort - this can happen both at rest and with exertion, such as taking the trash cans up or going up the steps, but does not happen every time he exerts himself. It can last minutes to hours without specific pattern. It is associated with a feeling that he needs to defecate but can't. He does report a tendency for looser stools. denies any associated SOB, nausea, vomiting. No palpitations, constipation, GI/GU bleeding, orthopnea, or edema. His wife has been urging Berger to seek care for this issue so he called our office and was advised to go to the ED. He went to [facility] where labs showed mildly elevated glucose 142, normal CBC, normal troponin, negative Covid panel. CXR NAD. He received 162 mg ASA, heparin bolus and drip, and SL NTG x1. He was hypertensive on arrival at 178/85 but  improved and BP has been normal most of the afternoon. EKG does not show any acute STT changes. With his prior MI he had lots of nausea and belching which isn't the case with this presentation. He does not have any acute symptoms at present time.  Hospital Course     Consultants: none  Chest pain CAD s/p CABG He underwent ETT which showed TWI in inferior and lateral leads. He proceeded to heart cath which showed patent LIMA-LAD, occluded SVG-diagonal, occluded SVG-PDA. Native diagonal showed a 50% lesion followed by a 75% lesion. A DES was placed to the 75% lesion. Normal FRF of mid RCA. Normal LVEF. DAPT planned for at least 6 momths with ASA and plavix. Continue 12.5 mg lopressor BID and 2.5 mg lisinopril.     Hypertension Reported elevated BP prior to admission with recent addition of NSAIDs for back pain.    Ischemic cardiomyopathy He was euvolemic at admission. Echo showed LVEF of 50-55% with RWMA, mild LVH, grade II DD, mildly dilated left atrium, and  trivial MR. Continue BB, ACEI.    Remote post-op Afib  At the time of his CABG, no recurrence. Not currently on [place].    Hyperlipidemia with LDL goal < 70 04/16/2021: Cholesterol 70; HDL 44; LDL Cholesterol 2; Triglycerides 120; VLDL 24 Satin intolerant. Continue repatha.    DM A1c was 8.7%.  Actos held given LV dysfunction. Will resume farxiga, glipizide, and metformin (to be resumed in 48 hrs). Given rise in A1c, will need titration of therapy, possibly insulin. Will defer to primary/endocrinology.   Right lower back pain No focal physical exam findings. CT renal stone protocol showed no acute findings, but did show cholelithiasis and aortic atherosclerosis. UA with glycosuria and ketones. No UTI. Has received tramadol, did not have pain this morning.    Pt was seen and examined by Dr. Johnsie Cancel and was deemed stable for discharge. Follow up has been arranged.   Should be a TOC in clinic.    Did the patient have an acute  coronary syndrome (MI, NSTEMI, STEMI, etc) this admission?:  No                               Did the patient have a percutaneous coronary intervention (stent / angioplasty)?:  Yes.     Cath/PCI Registry Performance & Quality Measures: Aspirin prescribed? - Yes ADP Receptor Inhibitor (Plavix/Clopidogrel, Brilinta/Ticagrelor or Effient/Prasugrel) prescribed (includes medically managed patients)? - Yes High Intensity Statin (Lipitor 40-80mg  or Crestor 20-40mg ) prescribed? - No - intolerant to statins, on repatha For EF <40%, was ACEI/ARB prescribed? - Yes For EF <40%, Aldosterone Antagonist (Spironolactone or Eplerenone) prescribed? - Not Applicable (EF >/= 34%) Cardiac Rehab Phase II ordered? - Yes      The patient will be scheduled for a TOC follow up appointment in 10-14 days.  A message has been sent to the [facility] and Scheduling Pool at the office where the patient should be seen for follow up.   _____________  Discharge Vitals Blood pressure 122/75, pulse 66, temperature 98.3 F (36.8 C), temperature source Oral, resp. rate 19, height 6' (1.829 m), weight 92.7 kg, SpO2 100 %.  Filed Weights   04/15/21 1002 04/15/21 1500 04/16/21 0406  Weight: 95.3 kg 92.8 kg 92.7 kg    Labs & Radiologic Studies    CBC Recent Labs    04/16/21 0416 04/17/21 0300  WBC 4.5 5.2  HGB 14.7 14.1  HCT 41.8 41.1  MCV 84.6 85.4  PLT 172 196   Basic Metabolic Panel Recent Labs    04/16/21 0416 04/17/21 0300  NA 137 138  K 3.4* 3.7  CL 103 107  CO2 26 23  GLUCOSE 158* 156*  BUN 14 13  CREATININE 0.69 0.77  CALCIUM 8.9 8.8*   Liver Function Tests Recent Labs    04/16/21 0416  AST 17  ALT 19  ALKPHOS 61  BILITOT 0.9  PROT 6.1*  ALBUMIN 3.7   No results for input(s): LIPASE, AMYLASE in the last 72 hours. High Sensitivity Troponin:   Recent Labs  Lab 04/15/21 1014 04/15/21 1214  TROPONINIHS 5 5    BNP Invalid input(s): POCBNP D-Dimer No results for input(s): DDIMER in  the last 72 hours. Hemoglobin A1C Recent Labs    04/16/21 0416  HGBA1C 8.7*   Fasting Lipid Panel Recent Labs    04/16/21 0416  CHOL 70  HDL 44  LDLCALC 2  TRIG 120  CHOLHDL 1.6  Thyroid Function Tests Recent Labs    04/16/21 0416  TSH 3.468   _____________  CARDIAC CATHETERIZATION  Result Date: 04/16/2021   Prox LAD to Mid LAD lesion is 99% stenosed.   1st Diag-1 lesion is 50% stenosed.   1st Diag-2 lesion is 75% stenosed.   A drug-eluting stent was successfully placed using a STENT ONYX FRONTIER 2.5X12.   Post intervention, there is a 0% residual stenosis.   1st Mrg lesion is 40% stenosed.   Prox RCA to Mid RCA lesion is 60% stenosed.   Dist RCA lesion is 50% stenosed.   RPAV lesion is 100% stenosed.   Origin lesion is 100% stenosed.   Origin lesion is 100% stenosed.   LIMA graft was visualized by angiography and is normal in caliber.   The graft exhibits no disease.   The left ventricular systolic function is normal.   LV end diastolic pressure is normal.   The left ventricular ejection fraction is 55-65% by visual estimate. 2 vessel obstructive CAD- 99% mid LAD, 70% mid first diagonal, PL 100%- chronic with collaterals Occluded SVG to the Diagonal and SVG to the PDA Normal RFR of the mid RCA Normal LV function Successful PCI of the mid diagonal with DES x 1 guided by RFR Plan: DAPT for 6 months with ASA and Plavix. Anticipate DC in am.   DG Chest Port 1 View  Result Date: 04/15/2021 CLINICAL DATA:  Chest pain. EXAM: PORTABLE CHEST 1 VIEW COMPARISON:  February 27, 2015. FINDINGS: Stable cardiomediastinal silhouette. Status post coronary bypass graft. Lungs are clear. Bony thorax is unremarkable. IMPRESSION: No active disease. Electronically Signed   By: Marijo Conception M.D.   On: 04/15/2021 11:02   ECHOCARDIOGRAM COMPLETE  Result Date: 04/16/2021    ECHOCARDIOGRAM REPORT   Patient Name:   LAURIE LOVEJOY Luper Date of Exam: 04/16/2021 Medical Rec #:  [id number]     Height:       72.0 in  Accession #:    [id number]    Weight:       204.4 lb Date of Birth:  [birth year]     BSA:          2.150 m Patient Age:    46 years      BP:           114/69 mmHg Patient Gender: M             HR:           57 bpm. Exam Location:  Inpatient Procedure: 2D Echo, Cardiac Doppler and Color Doppler Indications:     Chest Pain  History:         Patient has prior history of Echocardiogram examinations, most                  recent 05/18/2015. Risk Factors:Family History of Coronary                  Artery Disease, Hypertension, Diabetes and Dyslipidemia.  Sonographer:     Helmut Muster Referring Phys:  [place] Diagnosing Phys: Cherlynn Kaiser MD IMPRESSIONS  1. Left ventricular ejection fraction, by estimation, is 50 to 55%. The left ventricle has low normal function. The left ventricle demonstrates regional wall motion abnormalities (see scoring diagram/findings for description). There is mild left ventricular hypertrophy. Left ventricular diastolic parameters are consistent with Grade II diastolic dysfunction (pseudonormalization). There is Abnormal septal motion due to conduction delay.  2. Right ventricular systolic function  is normal. The right ventricular size is normal. Tricuspid regurgitation signal is inadequate for assessing PA pressure.  3. Left atrial size was mildly dilated.  4. The mitral valve is normal in structure. Trivial mitral valve regurgitation. No evidence of mitral stenosis.  5. The aortic valve is tricuspid. Aortic valve regurgitation is trivial. No aortic stenosis is present.  6. The inferior vena cava is normal in size with greater than 50% respiratory variability, suggesting right atrial pressure of 3 mmHg. FINDINGS  Left Ventricle: Left ventricular ejection fraction, by estimation, is 50 to 55%. The left ventricle has low normal function. The left ventricle demonstrates regional wall motion abnormalities. Definity contrast agent was given IV to delineate the left ventricular  endocardial borders. The left ventricular internal cavity size was normal in size. There is mild left ventricular hypertrophy. Abnormal septal motion due to conduction delay. Left ventricular diastolic parameters are consistent with Grade II diastolic dysfunction (pseudonormalization).  LV Wall Scoring: The inferior wall and basal inferolateral segment are hypokinetic. Right Ventricle: The right ventricular size is normal. No increase in right ventricular wall thickness. Right ventricular systolic function is normal. Tricuspid regurgitation signal is inadequate for assessing PA pressure. The tricuspid regurgitant velocity is 1.02 m/s, and with an assumed right atrial pressure of 3 mmHg, the estimated right ventricular systolic pressure is 7.2 mmHg. Left Atrium: Left atrial size was mildly dilated. Right Atrium: Right atrial size was normal in size. Pericardium: There is no evidence of pericardial effusion. Mitral Valve: The mitral valve is normal in structure. Trivial mitral valve regurgitation. No evidence of mitral valve stenosis. Tricuspid Valve: The tricuspid valve is normal in structure. Tricuspid valve regurgitation is trivial. No evidence of tricuspid stenosis. Aortic Valve: The aortic valve is tricuspid. Aortic valve regurgitation is trivial. No aortic stenosis is present. Pulmonic Valve: The pulmonic valve was normal in structure. Pulmonic valve regurgitation is trivial. No evidence of pulmonic stenosis. Aorta: The aortic root is normal in size and structure. Venous: The inferior vena cava is normal in size with greater than 50% respiratory variability, suggesting right atrial pressure of 3 mmHg. IAS/Shunts: No atrial level shunt detected by color flow Doppler.  LEFT VENTRICLE PLAX 2D LVIDd:         5.10 cm      Diastology LVIDs:         3.87 cm      LV e' medial:    5.95 cm/s LV PW:         1.10 cm      LV E/e' medial:  15.8 LV IVS:        1.10 cm      LV e' lateral:   13.40 cm/s LVOT diam:     2.20 cm       LV E/e' lateral: 7.0 LV SV:         62 LV SV Index:   29 LVOT Area:     3.80 cm  LV Volumes (MOD) LV vol d, MOD A2C: 147.5 ml LV vol d, MOD A4C: 124.5 ml LV vol s, MOD A2C: 75.6 ml LV vol s, MOD A4C: 68.5 ml LV SV MOD A2C:     71.9 ml LV SV MOD A4C:     124.5 ml LV SV MOD BP:      62.2 ml RIGHT VENTRICLE             IVC RV S prime:     14.40 cm/s  IVC diam: 2.20 cm TAPSE (M-mode): 2.1  cm LEFT ATRIUM             Index        RIGHT ATRIUM           Index LA diam:        3.80 cm 1.77 cm/m   RA Area:     16.35 cm LA Vol (A2C):   87.4 ml 40.64 ml/m  RA Volume:   41.70 ml  19.39 ml/m LA Vol (A4C):   55.2 ml 25.67 ml/m LA Biplane Vol: 73.2 ml 34.04 ml/m  AORTIC VALVE LVOT Vmax:   73.30 cm/s LVOT Vmean:  47.500 cm/s LVOT VTI:    0.163 m  AORTA Ao Root diam: 3.40 cm Ao Asc diam:  2.80 cm MITRAL VALVE               TRICUSPID VALVE MV Area (PHT): 2.16 cm    TR Peak grad:   4.2 mmHg MV Decel Time: 352 msec    TR Vmax:        102.00 cm/s MV E velocity: 94.30 cm/s MV A velocity: 69.20 cm/s  SHUNTS MV E/A ratio:  1.36        Systemic VTI:  0.16 m                            Systemic Diam: 2.20 cm Cherlynn Kaiser MD Electronically signed by Cherlynn Kaiser MD Signature Date/Time: 04/16/2021/1:17:50 PM    Final    Exercise Tolerance Test  Result Date: 04/16/2021   No ST deviation was noted.   T wave inversion noted in II, III, avF, V4-V6 which occurred early in exercise. Reviewed with Dr. Percival Berger who reviewed study and feels that with T wave inversion, ETT is abnormal. Plan cardiac cath.   CT RENAL STONE STUDY  Result Date: 04/15/2021 CLINICAL DATA:  Right-sided flank pain. EXAM: CT ABDOMEN AND PELVIS WITHOUT CONTRAST TECHNIQUE: Multidetector CT imaging of the abdomen and pelvis was performed following the standard protocol without IV contrast. COMPARISON:  None. FINDINGS: Lower chest: Unremarkable. Hepatobiliary: No focal abnormality in the liver on this study without intravenous contrast. Layering tiny calcified  gallstones. No intrahepatic or extrahepatic biliary dilation. Pancreas: No focal mass lesion. No dilatation of the main duct. No intraparenchymal cyst. No peripancreatic edema. Spleen: No splenomegaly. No focal mass lesion. Adrenals/Urinary Tract: No adrenal nodule or mass. Right kidney unremarkable. 3.2 cm lesion upper pole left kidney approaches water density, compatible with a cyst. No evidence for hydroureter. The urinary bladder appears normal for the degree of distention. Stomach/Bowel: Stomach is moderately distended with food. Duodenum is normally positioned as is the ligament of Treitz. No small bowel wall thickening. No small bowel dilatation. The terminal ileum is normal. The appendix is normal. No gross colonic mass. No colonic wall thickening. Vascular/Lymphatic: There is moderate atherosclerotic calcification of the abdominal aorta without aneurysm. There is no gastrohepatic or hepatoduodenal ligament lymphadenopathy. No retroperitoneal or mesenteric lymphadenopathy. No pelvic sidewall lymphadenopathy. Reproductive: The prostate gland and seminal vesicles are unremarkable. Other: No intraperitoneal free fluid. Musculoskeletal: No worrisome lytic or sclerotic osseous abnormality. IMPRESSION: 1. No acute findings in the abdomen or pelvis. Specifically, no findings to explain the patient's history of right flank pain. 2. Cholelithiasis. 3. Aortic Atherosclerosis (ICD10-I70.0). Electronically Signed   By: Misty Stanley M.D.   On: 04/15/2021 19:12   Disposition   Pt is being discharged home today in good condition.  Follow-up Plans & Appointments  Follow-up Information     Sueanne Margarita, MD Follow up on 04/27/2021.   Specialty: Cardiology Why: 11:30 am TOC Contact information: [address] [place] [address] [phone]                Discharge Instructions     Amb Referral to Cardiac Rehabilitation   Complete by: As directed    Diagnosis:  Coronary  Stents PTCA     After initial evaluation and assessments completed: Virtual Based Care may be provided alone or in conjunction with Phase 2 Cardiac Rehab based on patient barriers.: Yes   Diet - low sodium heart healthy   Complete by: As directed    Discharge instructions   Complete by: As directed    No driving for 2 days. No lifting over 5 lbs for 1 week. No sexual activity for 1 week. You may return to work after being seen in clinic. Keep procedure site clean & dry. If you notice increased pain, swelling, bleeding or pus, call/return!  You may shower, but no soaking baths/hot tubs/pools for 1 week.   Increase activity slowly   Complete by: As directed        Discharge Medications   Allergies as of 04/17/2021       Reactions   Codeine Rash   Phenergan [promethazine Hcl] Anxiety   Patient becomes restless & anxious        Medication List     STOP taking these medications    naproxen sodium 220 MG tablet Commonly known as: ALEVE   pioglitazone 15 MG tablet Commonly known as: ACTOS       TAKE these medications    Accu-Chek Aviva Plus test strip Generic drug: glucose blood 1 each by Other route as directed.   aspirin EC 81 MG tablet Take 81 mg by mouth daily. Swallow whole.   clopidogrel 75 MG tablet Commonly known as: PLAVIX Take 1 tablet (75 mg total) by mouth daily with breakfast. Start taking on: April 18, 2021   ezetimibe 10 MG tablet Commonly known as: ZETIA TAKE 1 TABLET BY MOUTH EVERY DAY   Farxiga 10 MG Tabs tablet Generic drug: dapagliflozin propanediol Take 10 mg by mouth daily.   glipiZIDE 10 MG tablet Commonly known as: GLUCOTROL Take 10 mg by mouth Twice daily.   lisinopril 2.5 MG tablet Commonly known as: ZESTRIL Take 1 tablet (2.5 mg total) by mouth daily.   metFORMIN 1000 MG tablet Commonly known as: GLUCOPHAGE Resume on 04/20/21. What changed:  how much to take how to take this when to take this additional instructions    metoprolol tartrate 25 MG tablet Commonly known as: LOPRESSOR Take 0.5 tablets (12.5 mg total) by mouth 2 (two) times daily. Must keep 05/20/21 appointment for further refills   nitroGLYCERIN 0.4 MG SL tablet Commonly known as: NITROSTAT Place 1 tablet (0.4 mg total) under the tongue every 5 (five) minutes x 3 doses as needed for chest pain.   Repatha SureClick 884 MG/ML Soaj Generic drug: Evolocumab INJECT 140 MG INTO THE SKIN EVERY 14 (FOURTEEN) DAYS.           Outstanding Labs/Studies   A1c - needs titration of meds  Duration of Discharge Encounter   Greater than 30 minutes including physician time.  Signed, Tami Lin Bryston Colocho, PA 04/17/2021, 10:30 AM

## 2021-04-19 ENCOUNTER — Encounter (HOSPITAL_COMMUNITY): Payer: Self-pay | Admitting: Cardiology

## 2021-04-19 LAB — POCT ACTIVATED CLOTTING TIME
Activated Clotting Time: 248 seconds
Activated Clotting Time: 254 s
Activated Clotting Time: 271 s

## 2021-04-19 NOTE — Telephone Encounter (Signed)
**Note De-Identified Joel Berger Obfuscation** Patient contacted regarding discharge from [facility] on 04/17/2021.  Patient understands to follow up with Dr Radford Pax on 04/27/2021 at 11:30 at [address], [place] in [place], [place] [address]. Patient understands discharge instructions? Yes Patient understands medications and regiment? Yes Patient understands to bring all medications to this visit? Yes  Ask patient:  Are you enrolled in My Chart: Yes  The pt states that he is doing okay this morning and denies CP, SOB, nausea, diaphoresis, dizziness or headache since returning home from the hospital. He has Dr Landis Gandy office phone number at [facility] to call us if he has any questions or concerns. He thanked me for my call.

## 2021-04-19 NOTE — Telephone Encounter (Signed)
**Note De-identified Diquan Kassis Obfuscation** -----  **Note De-Identified Harumi Yamin Obfuscation** Message from Ledora Bottcher, [place] sent at 04/17/2021  9:58 AM EDT ----- Pt will discharge today and is a TOC, will need a phone call.  Thanks Angie

## 2021-04-27 ENCOUNTER — Encounter: Payer: Self-pay | Admitting: Cardiology

## 2021-04-27 ENCOUNTER — Other Ambulatory Visit: Payer: Self-pay

## 2021-04-27 ENCOUNTER — Ambulatory Visit: Payer: BC Managed Care – PPO | Admitting: Cardiology

## 2021-04-27 VITALS — BP 142/64 | HR 66 | Ht 72.0 in | Wt 208.6 lb

## 2021-04-27 DIAGNOSIS — I2510 Atherosclerotic heart disease of native coronary artery without angina pectoris: Secondary | ICD-10-CM

## 2021-04-27 DIAGNOSIS — I10 Essential (primary) hypertension: Secondary | ICD-10-CM

## 2021-04-27 DIAGNOSIS — E785 Hyperlipidemia, unspecified: Secondary | ICD-10-CM

## 2021-04-27 DIAGNOSIS — I6521 Occlusion and stenosis of right carotid artery: Secondary | ICD-10-CM

## 2021-04-27 NOTE — Patient Instructions (Addendum)
Medication Instructions:  Your physician has recommended you make the following change in your medication: 1) STOP taking Zetia (ezetimibe) *If you need a refill on your cardiac medications before your next appointment, please call your pharmacy*   Lab Work: Fasting lipids in 8 weeks.  If you have labs (blood work) drawn today and your tests are completely normal, you will receive your results only by: [place] (if you have MyChart) OR A paper copy in the mail If you have any lab test that is abnormal or we need to change your treatment, we will call you to review the results.  Follow-Up: At [facility], you and your health needs are our priority.  As part of our continuing mission to provide you with exceptional heart care, we have created designated Provider Care Teams.  These Care Teams include your primary Cardiologist (physician) and Advanced Practice Providers (APPs -  Physician Assistants and Nurse Practitioners) who all work together to provide you with the care you need, when you need it.  Your next appointment:   6 month(s)  The format for your next appointment:   In Person  Provider:   You may see Fransico Him, MD or one of the following Advanced Practice Providers on your designated Care Team:   Melina Copa, PA-C Ermalinda Barrios, PA-C

## 2021-04-27 NOTE — Progress Notes (Signed)
Date:  04/27/2021   ID:  Joel Berger, DOB 05-Nov-1960, MRN [id number]   PCP:  Enid Skeens., MD  Cardiologist:  Fransico Him, MD Electrophysiologist:  None   Chief Complaint:  CAD, HTN  History of Present Illness:    Joel Berger is a 60 y.o. 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  Post op course was notable for AFib but converted to NSR. He has known mild right  carotid stenosis with 1-39%  by dopplers in 2020.    He recently presented to the emergency room with complaints left-sided dull chest discomfort for several weeks prior to admission that was nonexertional but also would notice it when taking the trash cans up or going up the steps.  He denied any shortness of breath or associated nausea vomiting or diaphoresis.  He went to [facility] where work-up was normal and was transferred to [facility] and started on IV heparin drip.  He was hypertensive at 178/85 mmHg on admission.  EKG was nonischemic.    He underwent ETT showing T wave inversions in the inferior and lateral leads and subsequently underwent heart cath showing patent LIMA to the LAD, occluded SVG to the diagonal, occluded SVG to the PDA and 50% diagonal lesion followed by 75% tandem lesion.  A DES was placed in the 75% diagonal lesion.  DAPT with ASA and Plavix.  Echo showed normal LV function with EF 50 to 55% with grade 2 diastolic dysfunction, mild left atrial enlargement.  He was continued on beta-blocker and ACE inhibitor.  He is here today for followup and is doing well.  He denies any chest pain or pressure, SOB, DOE, PND, orthopnea, LE edema, dizziness, palpitations or syncope. He is compliant with his meds and is tolerating meds with no SE.    Prior CV studies:   The following studies were reviewed today:  EKG and labs  Past Medical History:  Diagnosis Date   CAD  (coronary artery disease)    a. late presenting Inf MI >> LHC with 3v CAD >> s/p CABG with L-LAD, S-D1, S-PDA.   Diabetes mellitus    Dyslipidemia, goal LDL below 70 04/29/2015   Hypertension    Ischemic cardiomyopathy    Mild carotid artery disease (HCC)    Mitral regurgitation    Postoperative atrial fibrillation (HCC)    a. post CABG AF >> converted to NSR with Amiodarone   Past Surgical History:  Procedure Laterality Date   CARDIAC CATHETERIZATION N/A 03/01/2015   Procedure: Left Heart Cath and Coronary Angiography;  Surgeon: Belva Crome, MD;  Location: [place] [facility];  Service: Cardiovascular;  Laterality: N/A;   CORONARY ARTERY BYPASS GRAFT N/A 03/03/2015   Procedure: CORONARY ARTERY BYPASS GRAFTING (CABG), ON PUMP, TIMES THREE, USING LEFT INTERNAL MAMMARY ARTERY, RIGHT GREATER SAPHENOUS VEIN HARVESTED ENDOSCOPICALLY;  Surgeon: Ivin Poot, MD;  Location: [place];  Service: Open Heart Surgery;  Laterality: N/A;  LIMA TO LAD, SVG TO DIAGONAL, SVG TO PDA   CORONARY STENT INTERVENTION N/A 04/16/2021   Procedure: CORONARY STENT INTERVENTION;  Surgeon: Martinique, Peter M, MD;  Location: [place] [facility];  Service: Cardiovascular;  Laterality: N/A;   INTRAVASCULAR PRESSURE WIRE/FFR STUDY N/A 04/16/2021   Procedure: INTRAVASCULAR PRESSURE WIRE/FFR STUDY;  Surgeon: Martinique, Peter M, MD;  Location: [place] [facility]  LAB;  Service: Cardiovascular;  Laterality: N/A;   LEFT HEART CATH AND CORS/GRAFTS ANGIOGRAPHY N/A 04/16/2021   Procedure: LEFT HEART CATH AND CORS/GRAFTS ANGIOGRAPHY;  Surgeon: Martinique, Peter M, MD;  Location: [place] [facility];  Service: Cardiovascular;  Laterality: N/A;   TEE WITHOUT CARDIOVERSION N/A 03/03/2015   Procedure: TRANSESOPHAGEAL ECHOCARDIOGRAM (TEE);  Surgeon: Ivin Poot, MD;  Location: [place];  Service: Open Heart Surgery;  Laterality: N/A;     Current Meds  Medication Sig   ACCU-CHEK AVIVA PLUS test strip 1 each by Other route as directed.    aspirin EC 81  MG tablet Take 81 mg by mouth daily. Swallow whole.   clopidogrel (PLAVIX) 75 MG tablet Take 1 tablet (75 mg total) by mouth daily with breakfast.   ezetimibe (ZETIA) 10 MG tablet TAKE 1 TABLET BY MOUTH EVERY DAY   FARXIGA 10 MG TABS tablet Take 10 mg by mouth daily.   glipiZIDE (GLUCOTROL) 10 MG tablet Take 10 mg by mouth Twice daily.    lisinopril (ZESTRIL) 2.5 MG tablet TAKE 1 TABLET BY MOUTH EVERY DAY   metFORMIN (GLUCOPHAGE) 1000 MG tablet Resume on 04/20/21. (Patient taking differently: Take 1,000 mg by mouth daily. Resume on 04/20/21.)   metoprolol tartrate (LOPRESSOR) 25 MG tablet Take 0.5 tablets (12.5 mg total) by mouth 2 (two) times daily.   nitroGLYCERIN (NITROSTAT) 0.4 MG SL tablet Place 1 tablet (0.4 mg total) under the tongue every 5 (five) minutes x 3 doses as needed for chest pain.   REPATHA SURECLICK 403 MG/ML SOAJ INJECT 140 MG INTO THE SKIN EVERY 14 (FOURTEEN) DAYS.     Allergies:   Codeine and Phenergan [promethazine hcl]   Social History   Tobacco Use   Smoking status: Never   Smokeless tobacco: Never  Vaping Use   Vaping Use: Never used  Substance Use Topics   Alcohol use: Yes    Comment: OCCASIONAL BEER,ONCE - TWICE WEEKLY   Drug use: No     Family Hx: The patient's family history includes Colon polyps in his paternal uncle; Heart attack in his brother and father; Heart failure in his mother. There is no history of Colon cancer.  ROS:   Please see the history of present illness.    \ All other systems reviewed and are negative.   Labs/Other Tests and Data Reviewed:    Recent Labs: 04/16/2021: ALT 19; TSH 3.468 04/17/2021: BUN 13; Creatinine, Ser 0.77; Hemoglobin 14.1; Platelets 161; Potassium 3.7; Sodium 138   Recent Lipid Panel Lab Results  Component Value Date/Time   CHOL 70 04/16/2021 04:16 AM   CHOL 133 04/20/2020 08:41 AM   TRIG 120 04/16/2021 04:16 AM   HDL 44 04/16/2021 04:16 AM   HDL 53 04/20/2020 08:41 AM   CHOLHDL 1.6 04/16/2021  04:16 AM   LDLCALC 2 04/16/2021 04:16 AM   LDLCALC 58 04/20/2020 08:41 AM    Wt Readings from Last 3 Encounters:  04/27/21 208 lb 9.6 oz (94.6 kg)  04/16/21 204 lb 6.4 oz (92.7 kg)  04/20/20 221 lb (100.2 kg)    EKG was reviewed today and showed NSR with no ST changes Objective:    Vital Signs:  BP (!) 142/64   Pulse 66   Ht 6' (1.829 m)   Wt 208 lb 9.6 oz (94.6 kg)   SpO2 94%   BMI 28.29 kg/m    GEN: Well nourished, well developed in no acute distress HEENT: Normal NECK: No JVD; No carotid bruits LYMPHATICS:  No lymphadenopathy CARDIAC:RRR, no murmurs, rubs, gallops RESPIRATORY:  Clear to auscultation without rales, wheezing or rhonchi  ABDOMEN: Soft, non-tender, non-distended MUSCULOSKELETAL:  No edema; No deformity  SKIN: Warm and dry NEUROLOGIC:  Alert and oriented x 3 PSYCHIATRIC:  Normal affect    ASSESSMENT & PLAN:    1.  ASCAD  - s/p inferior STEMI with cath showing occluded distal RCA, 90% LAD and 70% D1 stenosis with EF 45-50%. -s/p CABG with LIMA-LAD, SVG-D1 and SVG-PDA.  -Recent admission for chest pain with cath showing patent LIMA to LAD but occluded SVG to diagonal and SVG to PDA.  He was found to have 50% and 75% tandem lesions in the diagonal and underwent DES to the 75% lesion.  FFR of the mid RCA was normal.  He was placed on DAPT for at least 6 months with aspirin and Plavix and continued on his beta-blocker and ACE inhibitor. -He is feeling well without any further anginal symptoms. -He will continue on prescription drug management with aspirin 81 mg daily, Plavix 75 mg daily, Lopressor 12.5 mg twice daily, ACE inhibitor and Repatha with as needed refills -He is statin intolerant   2.  HTN  -His BP is borderline controlled on exam today -continue Lisinopril 2.5mg  daily and increase Lopressor to 25 mg twice daily -Labs showed a serum creatinine of 0.77 and potassium 3.7 on 04/17/2021  3.  Hyperlipidemia  - his LDL goal is < 70.   -He is statin  intolerant  -LDL was 2 on 04/16/2021, HDL 44 and triglycerides 120 -continue prescription drug management with Repatha and I will stop his Zetia since his LDL is so low  -check FLP and ALT in 8 weeks  4.  Right carotid artery stenosis  - Dopplers 2020 showed right 1-39% stenosis.   -continue ASA   Medication Adjustments/Labs and Tests Ordered: Current medicines are reviewed at length with the patient today.  Concerns regarding medicines are outlined above.  Tests Ordered: No orders of the defined types were placed in this encounter.  Medication Changes: No orders of the defined types were placed in this encounter.   Disposition:  Follow up in 1 year(s)  Signed, Fransico Him, MD  04/27/2021 11:46 AM    [facility]

## 2021-05-20 ENCOUNTER — Ambulatory Visit: Payer: BC Managed Care – PPO | Admitting: Cardiology

## 2021-07-08 ENCOUNTER — Other Ambulatory Visit: Payer: Self-pay

## 2021-07-08 ENCOUNTER — Other Ambulatory Visit: Payer: BC Managed Care – PPO | Admitting: *Deleted

## 2021-07-08 DIAGNOSIS — I10 Essential (primary) hypertension: Secondary | ICD-10-CM

## 2021-07-08 DIAGNOSIS — E785 Hyperlipidemia, unspecified: Secondary | ICD-10-CM

## 2021-07-08 DIAGNOSIS — I2510 Atherosclerotic heart disease of native coronary artery without angina pectoris: Secondary | ICD-10-CM

## 2021-07-08 LAB — LIPID PANEL
Chol/HDL Ratio: 1.9 ratio (ref 0.0–5.0)
Cholesterol, Total: 101 mg/dL (ref 100–199)
HDL: 52 mg/dL (ref >39)
LDL Chol Calc (NIH): 28 mg/dL (ref 0–99)
Triglycerides: 119 mg/dL (ref 0–149)
VLDL Cholesterol Cal: 21 mg/dL (ref 5–40)

## 2021-09-10 ENCOUNTER — Telehealth: Payer: Self-pay | Admitting: Cardiology

## 2021-09-10 NOTE — Telephone Encounter (Signed)
Patient called saying he needs a prior-auth on his repatha.  His insurance recently change to [facility] # [id number]  ID# [id number] ?

## 2021-09-13 NOTE — Telephone Encounter (Signed)
Joel Berger (Key: [id number]) ?Repatha SureClick '140MG'$ /ML auto-injectors ?Status: PA Response - Approved ?Created: March 13th, 2023 ?Sent: March 13th, 2023 ?Approval dates: ?Effective from 09/13/2021 through 09/12/2022. ?

## 2021-12-14 ENCOUNTER — Other Ambulatory Visit: Payer: Self-pay | Admitting: Cardiology

## 2021-12-22 ENCOUNTER — Telehealth: Payer: Self-pay | Admitting: Cardiology

## 2021-12-22 NOTE — Telephone Encounter (Signed)
*  STAT* If patient is at the pharmacy, call can be transferred to refill team.   1. Which medications need to be refilled? (please list name of each medication and dose if known)  REPATHA SURECLICK 740 MG/ML SOAJ  2. Which pharmacy/location (including street and city if local pharmacy) is medication to be sent to? [id number] [place], [place] - [address]  3. Do they need a 30 day or 90 day supply? 30 day

## 2021-12-22 NOTE — Telephone Encounter (Signed)
Refill sent in another encounter.

## 2021-12-30 ENCOUNTER — Encounter: Payer: Self-pay | Admitting: Gastroenterology

## 2022-02-18 ENCOUNTER — Encounter: Payer: Self-pay | Admitting: Gastroenterology

## 2022-03-02 ENCOUNTER — Telehealth: Payer: Self-pay | Admitting: *Deleted

## 2022-03-02 NOTE — Telephone Encounter (Signed)
While prepping pt's chart for PV on 03-16-22, noted he is on Plavix.  I called pt to clarify and he is on this medication still.  Explained need for OV prior to procedure.  OV made with Carl Best on 04-04-22 at 11:30 am.  Original colonoscopy was scheduled for 04-07-22; this is cancelled.  Pt will check with wife's schedule and call back to reschedule.

## 2022-04-04 ENCOUNTER — Ambulatory Visit: Payer: BC Managed Care – PPO | Admitting: Nurse Practitioner

## 2022-04-07 ENCOUNTER — Other Ambulatory Visit: Payer: Self-pay | Admitting: Cardiology

## 2022-04-07 ENCOUNTER — Encounter: Payer: BC Managed Care – PPO | Admitting: Gastroenterology

## 2022-04-14 ENCOUNTER — Other Ambulatory Visit: Payer: Self-pay | Admitting: Cardiology

## 2022-04-19 ENCOUNTER — Other Ambulatory Visit: Payer: Self-pay

## 2022-04-19 MED ORDER — CLOPIDOGREL BISULFATE 75 MG PO TABS: 75.0000 mg | ORAL_TABLET | Freq: Every day | ORAL | 3 refills | Status: AC

## 2022-04-20 ENCOUNTER — Other Ambulatory Visit: Payer: Self-pay

## 2022-07-13 ENCOUNTER — Ambulatory Visit: Payer: BC Managed Care – PPO | Attending: Cardiology | Admitting: Cardiology

## 2022-07-13 ENCOUNTER — Encounter: Payer: Self-pay | Admitting: Cardiology

## 2022-07-13 VITALS — BP 148/90 | HR 59 | Ht 71.0 in | Wt 188.8 lb

## 2022-07-13 DIAGNOSIS — I10 Essential (primary) hypertension: Secondary | ICD-10-CM | POA: Diagnosis not present

## 2022-07-13 DIAGNOSIS — E785 Hyperlipidemia, unspecified: Secondary | ICD-10-CM | POA: Diagnosis not present

## 2022-07-13 DIAGNOSIS — I2510 Atherosclerotic heart disease of native coronary artery without angina pectoris: Secondary | ICD-10-CM

## 2022-07-13 DIAGNOSIS — I6521 Occlusion and stenosis of right carotid artery: Secondary | ICD-10-CM | POA: Diagnosis not present

## 2022-07-13 MED ORDER — LISINOPRIL 10 MG PO TABS: 10.0000 mg | ORAL_TABLET | Freq: Every day | ORAL | 3 refills | Status: AC

## 2022-07-13 NOTE — Addendum Note (Signed)
Addended by: Molli Barrows on: 07/13/2022 10:39 AM   Modules accepted: Orders

## 2022-07-13 NOTE — Patient Instructions (Signed)
Medication Instructions:  Your physician has recommended you make the following change in your medication:   1) INCREASE lisinopril to '10mg'$  daily  *If you need a refill on your cardiac medications before your next appointment, please call your pharmacy*  Lab Work: In 1 week: fasting Lipid panel and CMET If you have labs (blood work) drawn today and your tests are completely normal, you will receive your results only by: [place] (if you have MyChart) OR A paper copy in the mail If you have any lab test that is abnormal or we need to change your treatment, we will call you to review the results.   Testing/Procedures: Your physician has requested that you have a bilateral carotid duplex. This test is an ultrasound of the carotid arteries in your neck. It looks at blood flow through these arteries that supply the brain with blood. Allow one hour for this exam. There are no restrictions or special instructions.   Follow-Up: At [facility], you and your health needs are our priority.  As part of our continuing mission to provide you with exceptional heart care, we have created designated Provider Care Teams.  These Care Teams include your primary Cardiologist (physician) and Advanced Practice Providers (APPs -  Physician Assistants and Nurse Practitioners) who all work together to provide you with the care you need, when you need it.  Your next appointment:   1 year(s)  The format for your next appointment:   In Person  Provider:   Fransico Him, MD  Important Information About Sugar

## 2022-07-13 NOTE — Progress Notes (Signed)
Date:  07/13/2022   ID:  Joel Berger, DOB 01-22-1961, MRN [id number]   PCP:  Enid Skeens., MD  Cardiologist:  Fransico Him, MD Electrophysiologist:  None   Chief Complaint:  CAD, HTN  History of Present Illness:    Joel Berger is a 62 y.o. 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  Post op course was notable for AFib but converted to NSR. He has known mild right  carotid stenosis with 1-39%  by dopplers in 2020.    He recently presented to the emergency room with complaints left-sided dull chest discomfort for several weeks prior to admission that was nonexertional but also would notice it when taking the trash cans up or going up the steps.  He denied any shortness of breath or associated nausea vomiting or diaphoresis.  He went to [facility] where work-up was normal and was transferred to [facility] and started on IV heparin drip.  He was hypertensive at 178/85 mmHg on admission.  EKG was nonischemic.    He underwent ETT showing T wave inversions in the inferior and lateral leads and subsequently underwent heart cath showing patent LIMA to the LAD, occluded SVG to the diagonal, occluded SVG to the PDA and 50% diagonal lesion followed by 75% tandem lesion.  A DES was placed in the 75% diagonal lesion.  DAPT with ASA and Plavix.  Echo showed normal LV function with EF 50 to 55% with grade 2 diastolic dysfunction, mild left atrial enlargement.  He was continued on beta-blocker and ACE inhibitor.  He is here today for followup and is doing well.  He has had 1-2 episodes of angina in the past year and took a NTG with complete resolution.   He denies any SOB, DOE, PND, orthopnea, LE edema, dizziness, palpitations or syncope. He is compliant with his meds and is tolerating meds with no SE.    Prior CV studies:   The following studies were reviewed  today:  EKG and labs  Past Medical History:  Diagnosis Date   CAD (coronary artery disease)    a. late presenting Inf MI >> LHC with 3v CAD >> s/p CABG with L-LAD, S-D1, S-PDA.   Diabetes mellitus    Dyslipidemia, goal LDL below 70 04/29/2015   Hypertension    Ischemic cardiomyopathy    Mild carotid artery disease (HCC)    Mitral regurgitation    Postoperative atrial fibrillation (HCC)    a. post CABG AF >> converted to NSR with Amiodarone   Past Surgical History:  Procedure Laterality Date   CARDIAC CATHETERIZATION N/A 03/01/2015   Procedure: Left Heart Cath and Coronary Angiography;  Surgeon: Belva Crome, MD;  Location: [place] [facility];  Service: Cardiovascular;  Laterality: N/A;   CORONARY ARTERY BYPASS GRAFT N/A 03/03/2015   Procedure: CORONARY ARTERY BYPASS GRAFTING (CABG), ON PUMP, TIMES THREE, USING LEFT INTERNAL MAMMARY ARTERY, RIGHT GREATER SAPHENOUS VEIN HARVESTED ENDOSCOPICALLY;  Surgeon: Ivin Poot, MD;  Location: [place];  Service: Open Heart Surgery;  Laterality: N/A;  LIMA TO LAD, SVG TO DIAGONAL, SVG TO PDA   CORONARY STENT INTERVENTION N/A 04/16/2021   Procedure: CORONARY STENT INTERVENTION;  Surgeon: Martinique, Peter M, MD;  Location: [place] [facility];  Service: Cardiovascular;  Laterality: N/A;   INTRAVASCULAR PRESSURE WIRE/FFR STUDY N/A 04/16/2021  Procedure: INTRAVASCULAR PRESSURE WIRE/FFR STUDY;  Surgeon: Martinique, Peter M, MD;  Location: [place] [facility];  Service: Cardiovascular;  Laterality: N/A;   LEFT HEART CATH AND CORS/GRAFTS ANGIOGRAPHY N/A 04/16/2021   Procedure: LEFT HEART CATH AND CORS/GRAFTS ANGIOGRAPHY;  Surgeon: Martinique, Peter M, MD;  Location: [place] [facility];  Service: Cardiovascular;  Laterality: N/A;   TEE WITHOUT CARDIOVERSION N/A 03/03/2015   Procedure: TRANSESOPHAGEAL ECHOCARDIOGRAM (TEE);  Surgeon: Ivin Poot, MD;  Location: [place];  Service: Open Heart Surgery;  Laterality: N/A;     Current Meds  Medication Sig   ACCU-CHEK  AVIVA PLUS test strip 1 each by Other route as directed.    aspirin EC 81 MG tablet Take 81 mg by mouth daily. Swallow whole.   clopidogrel (PLAVIX) 75 MG tablet Take 1 tablet (75 mg total) by mouth daily with breakfast.   Evolocumab (REPATHA SURECLICK) 308 MG/ML SOAJ INJECT 140 MG INTO THE SKIN EVERY 14 (FOURTEEN) DAYS.   FARXIGA 10 MG TABS tablet Take 10 mg by mouth daily.   glipiZIDE (GLUCOTROL) 10 MG tablet Take 10 mg by mouth Twice daily.    JANUVIA 100 MG tablet Take 100 mg by mouth daily.   lisinopril (ZESTRIL) 2.5 MG tablet Take 1 tablet (2.5 mg total) by mouth daily. Pt needs to keep upcoming appt in Jan, 2024 for further refills   metFORMIN (GLUCOPHAGE) 1000 MG tablet Resume on 04/20/21.   metoprolol tartrate (LOPRESSOR) 25 MG tablet Take 0.5 tablets (12.5 mg total) by mouth 2 (two) times daily. Please attend scheduled appointment for additional refills.   nitroGLYCERIN (NITROSTAT) 0.4 MG SL tablet Place 1 tablet (0.4 mg total) under the tongue every 5 (five) minutes x 3 doses as needed for chest pain.     Allergies:   Codeine and Phenergan [promethazine hcl]   Social History   Tobacco Use   Smoking status: Never   Smokeless tobacco: Never  Vaping Use   Vaping Use: Never used  Substance Use Topics   Alcohol use: Yes    Comment: OCCASIONAL BEER,ONCE - TWICE WEEKLY   Drug use: No     Family Hx: The patient's family history includes Colon polyps in his paternal uncle; Heart attack in his brother and father; Heart failure in his mother. There is no history of Colon cancer.  ROS:   Please see the history of present illness.    \ All other systems reviewed and are negative.   Labs/Other Tests and Data Reviewed:    Recent Labs: No results found for requested labs within last 365 days.   Recent Lipid Panel Lab Results  Component Value Date/Time   CHOL 101 07/08/2021 09:17 AM   TRIG 119 07/08/2021 09:17 AM   HDL 52 07/08/2021 09:17 AM   CHOLHDL 1.9 07/08/2021 09:17 AM    CHOLHDL 1.6 04/16/2021 04:16 AM   LDLCALC 28 07/08/2021 09:17 AM    Wt Readings from Last 3 Encounters:  07/13/22 188 lb 12.8 oz (85.6 kg)  04/27/21 208 lb 9.6 oz (94.6 kg)  04/16/21 204 lb 6.4 oz (92.7 kg)    EKG was reviewed today and showed sinus bradycardia at 59bpm with no ST changes Objective:    Vital Signs:  BP (!) 148/90   Pulse (!) 59   Ht '5\' 11"'$  (1.803 m)   Wt 188 lb 12.8 oz (85.6 kg)   SpO2 99%   BMI 26.33 kg/m    GEN: Well nourished, well developed in no acute distress HEENT:  Normal NECK: No JVD; No carotid bruits LYMPHATICS: No lymphadenopathy CARDIAC:RRR, no murmurs, rubs, gallops RESPIRATORY:  Clear to auscultation without rales, wheezing or rhonchi  ABDOMEN: Soft, non-tender, non-distended MUSCULOSKELETAL:  No edema; No deformity  SKIN: Warm and dry NEUROLOGIC:  Alert and oriented x 3 PSYCHIATRIC:  Normal affect  ASSESSMENT & PLAN:    1.  ASCAD  - s/p inferior STEMI with cath showing occluded distal RCA, 90% LAD and 70% D1 stenosis with EF 45-50%. -s/p CABG with LIMA-LAD, SVG-D1 and SVG-PDA.  -Recent admission for chest pain with cath showing patent LIMA to LAD but occluded SVG to diagonal and SVG to PDA.  He was found to have 50% and 75% tandem lesions in the diagonal and underwent DES to the 75% lesion.  FFR of the mid RCA was normal.  He was placed on DAPT for at least 6 months with aspirin and Plavix and continued on his beta-blocker and ACE inhibitor. -He is doing well and has only had 1-2 episodes of CP in the past year resolved with NTG -Continue prescription drug managed with aspirin 81 mg daily, Plavix 75 mg daily, Lopressor 12.5 mg twice daily and Repatha with as needed refills -He is statin intolerant  -I have told him to let me know if he has any further angina  2.  HTN  -BP is borderline controlled on exam today but is normal at home -Continue prescription drug management with Lopressor 12.5 mg twice daily with as needed refills -increase  Lisinopril to '10mg'$  daily  -Check bmet in 1 week -I have asked him to check his BP daily for a week and call with results  3.  Hyperlipidemia  - his LDL goal is < 70.   -He is statin intolerant  -Check FLP and ALT -Continue prescription drug management with Repatha  4.  Right carotid artery stenosis  - Dopplers 2020 showed right 1-39% stenosis.   -Repeat carotid Dopplers -Continue aspirin 81 mg daily and Repatha   Medication Adjustments/Labs and Tests Ordered: Current medicines are reviewed at length with the patient today.  Concerns regarding medicines are outlined above.  Tests Ordered: Orders Placed This Encounter  Procedures   EKG 12-Lead    Medication Changes: No orders of the defined types were placed in this encounter.    Disposition:  Follow up in 1 year(s)  Signed, Fransico Him, MD  07/13/2022 10:27 AM    [facility]

## 2022-07-20 ENCOUNTER — Telehealth: Payer: Self-pay

## 2022-07-20 ENCOUNTER — Ambulatory Visit: Payer: BC Managed Care – PPO | Attending: Cardiology

## 2022-07-20 DIAGNOSIS — E785 Hyperlipidemia, unspecified: Secondary | ICD-10-CM

## 2022-07-20 DIAGNOSIS — I2510 Atherosclerotic heart disease of native coronary artery without angina pectoris: Secondary | ICD-10-CM

## 2022-07-20 DIAGNOSIS — I10 Essential (primary) hypertension: Secondary | ICD-10-CM

## 2022-07-20 LAB — COMPREHENSIVE METABOLIC PANEL
ALT: 14 IU/L (ref 0–44)
AST: 16 IU/L (ref 0–40)
Albumin/Globulin Ratio: 2.4 — ABNORMAL HIGH (ref 1.2–2.2)
Albumin: 4.7 g/dL (ref 3.9–4.9)
Alkaline Phosphatase: 76 IU/L (ref 44–121)
BUN/Creatinine Ratio: 18 (ref 10–24)
BUN: 14 mg/dL (ref 8–27)
Bilirubin Total: 0.5 mg/dL (ref 0.0–1.2)
CO2: 28 mmol/L (ref 20–29)
Calcium: 9.9 mg/dL (ref 8.6–10.2)
Chloride: 102 mmol/L (ref 96–106)
Creatinine, Ser: 0.76 mg/dL (ref 0.76–1.27)
Globulin, Total: 2 g/dL (ref 1.5–4.5)
Glucose: 112 mg/dL — ABNORMAL HIGH (ref 70–99)
Potassium: 4.5 mmol/L (ref 3.5–5.2)
Sodium: 141 mmol/L (ref 134–144)
Total Protein: 6.7 g/dL (ref 6.0–8.5)
eGFR: 102 mL/min/{1.73_m2} (ref >59)

## 2022-07-20 LAB — LIPID PANEL
Chol/HDL Ratio: 1.8 ratio (ref 0.0–5.0)
Cholesterol, Total: 90 mg/dL — ABNORMAL LOW (ref 100–199)
HDL: 50 mg/dL (ref >39)
LDL Chol Calc (NIH): 22 mg/dL (ref 0–99)
Triglycerides: 94 mg/dL (ref 0–149)
VLDL Cholesterol Cal: 18 mg/dL (ref 5–40)

## 2022-07-20 NOTE — Telephone Encounter (Signed)
Reviewed with patient that per Dr. Radford Pax, BP's are satisfactory. Advised patient to continue with current medication regimen. Patient verbalizes understanding.

## 2022-07-20 NOTE — Telephone Encounter (Signed)
Patient BP log:  07/12/22: 133/83 HR 63  07/13/22: 131/87 HR 64  07/14/22: 134/73 HR 68  07/15/22: 121/77 HR 63  07/16/22: 139/80 HR 62  07/17/22: 116/83 HR 61  07/18/22: 139/68 HR 62  07/19/22: 105/55 HR 58  07/20/22: 118/68 HR 63

## 2022-07-22 ENCOUNTER — Telehealth: Payer: Self-pay

## 2022-07-22 NOTE — Telephone Encounter (Signed)
-----  Message from Bernestine Amass, RN sent at 07/21/2022  8:57 AM EST -----  ----- Message ----- From: Sueanne Margarita, MD Sent: 07/20/2022   8:35 PM EST To: [facility] Triage  Stable labs - continue current meds and forward to PCP

## 2022-08-10 ENCOUNTER — Encounter: Payer: Self-pay | Admitting: Cardiology

## 2022-08-10 ENCOUNTER — Ambulatory Visit (HOSPITAL_COMMUNITY)
Admission: RE | Admit: 2022-08-10 | Discharge: 2022-08-10 | Disposition: A | Discharge status: Home / Self Care | Payer: BC Managed Care – PPO | Source: Ambulatory Visit | Attending: Cardiovascular Disease | Admitting: Cardiovascular Disease

## 2022-08-10 DIAGNOSIS — I6521 Occlusion and stenosis of right carotid artery: Secondary | ICD-10-CM | POA: Diagnosis not present

## 2022-08-12 ENCOUNTER — Telehealth: Payer: Self-pay

## 2022-08-12 DIAGNOSIS — I6521 Occlusion and stenosis of right carotid artery: Secondary | ICD-10-CM

## 2022-08-12 NOTE — Telephone Encounter (Signed)
-----   Message from Sueanne Margarita, MD sent at 08/10/2022  3:59 PM EST ----- Carotid Doppler showed 1 to 39% right ICA stenosis -repeat Dopplers in 1 year

## 2022-08-12 NOTE — Telephone Encounter (Signed)
Reviewed results with patient and explained risks of further narrowing of ICA such as stroke, syncope. Reviewed Dr. Theodosia Blender advice to repeat dopplers in 1 year, patient agrees to plan.

## 2022-08-17 ENCOUNTER — Telehealth: Payer: Self-pay

## 2022-08-17 NOTE — Telephone Encounter (Signed)
-----   Message from Bernestine Amass, RN sent at 07/21/2022  8:57 AM EST -----  ----- Message ----- From: Sueanne Margarita, MD Sent: 07/20/2022   8:35 PM EST To: [facility] Triage  Stable labs - continue current meds and forward to PCP

## 2022-08-17 NOTE — Telephone Encounter (Signed)
Called and attempted to leave detailed message on voice mail per DPR, mailbox full.

## 2022-08-18 NOTE — Telephone Encounter (Signed)
Patient was returning call. Please advise  

## 2022-08-19 NOTE — Telephone Encounter (Signed)
Labs reviewed with patient, who verbalized understanding of stable results and to continue medication regimen.  Forwarded to PCP.

## 2022-09-26 ENCOUNTER — Other Ambulatory Visit (HOSPITAL_COMMUNITY): Payer: Self-pay

## 2023-08-11 ENCOUNTER — Encounter (HOSPITAL_COMMUNITY): Payer: BC Managed Care – PPO
